# Patient Record
Sex: Female | Born: 1950 | Race: White | Hispanic: No | Marital: Married | Smoking: Never smoker
Health system: Southern US, Community
[De-identification: ages and names within clinical notes are randomized; demographics above are authoritative.]

## PROBLEM LIST (undated history)

## (undated) DIAGNOSIS — E079 Disorder of thyroid, unspecified: Secondary | ICD-10-CM

## (undated) DIAGNOSIS — I2510 Atherosclerotic heart disease of native coronary artery without angina pectoris: Secondary | ICD-10-CM

## (undated) DIAGNOSIS — I6529 Occlusion and stenosis of unspecified carotid artery: Secondary | ICD-10-CM

## (undated) DIAGNOSIS — Z955 Presence of coronary angioplasty implant and graft: Secondary | ICD-10-CM

## (undated) DIAGNOSIS — M419 Scoliosis, unspecified: Secondary | ICD-10-CM

## (undated) HISTORY — DX: Atherosclerotic heart disease of native coronary artery without angina pectoris: I25.10

## (undated) HISTORY — DX: Scoliosis, unspecified: M41.9

## (undated) HISTORY — DX: Occlusion and stenosis of unspecified carotid artery: I65.29

## (undated) HISTORY — PX: CORONARY ARTERY BYPASS GRAFT: SHX141

## (undated) HISTORY — DX: Presence of coronary angioplasty implant and graft: Z95.5

## (undated) HISTORY — PX: CORONARY ANGIOPLASTY: SHX604

## (undated) HISTORY — PX: TONSILLECTOMY: SUR1361

## (undated) HISTORY — DX: Disorder of thyroid, unspecified: E07.9

---

## 1997-05-28 HISTORY — PX: BREAST CYST EXCISION: SHX579

## 2002-07-27 HISTORY — PX: CARDIAC SURGERY: SHX584

## 2003-04-08 ENCOUNTER — Ambulatory Visit
Admit: 2003-04-08 | Disposition: A | Discharge status: Home / Self Care | Payer: Self-pay | Source: Ambulatory Visit | Admitting: Surgery

## 2003-05-17 ENCOUNTER — Ambulatory Visit: Admission: RE | Admit: 2003-05-17 | Payer: Self-pay | Source: Ambulatory Visit | Admitting: Surgery

## 2003-07-21 ENCOUNTER — Inpatient Hospital Stay
Admission: RE | Admit: 2003-07-21 | Disposition: A | Discharge status: Home / Self Care | Payer: Self-pay | Source: Ambulatory Visit

## 2003-07-29 ENCOUNTER — Ambulatory Visit: Admit: 2003-07-29 | Disposition: A | Discharge status: Home / Self Care | Payer: Self-pay | Source: Ambulatory Visit

## 2003-08-06 ENCOUNTER — Ambulatory Visit: Admit: 2003-08-06 | Disposition: A | Discharge status: Home / Self Care | Payer: Self-pay | Source: Ambulatory Visit

## 2003-08-08 ENCOUNTER — Inpatient Hospital Stay
Admission: EM | Admit: 2003-08-08 | Disposition: A | Discharge status: Home / Self Care | Payer: Self-pay | Source: Emergency Department | Admitting: Specialist

## 2003-08-23 ENCOUNTER — Ambulatory Visit: Admit: 2003-08-23 | Disposition: A | Discharge status: Home / Self Care | Payer: Self-pay | Source: Ambulatory Visit

## 2003-08-27 ENCOUNTER — Ambulatory Visit: Admit: 2003-08-27 | Disposition: A | Discharge status: Home / Self Care | Payer: Self-pay | Source: Ambulatory Visit

## 2003-09-03 ENCOUNTER — Ambulatory Visit: Admit: 2003-09-03 | Disposition: A | Discharge status: Home / Self Care | Payer: Self-pay | Source: Ambulatory Visit

## 2004-03-02 ENCOUNTER — Ambulatory Visit: Admit: 2004-03-02 | Disposition: A | Discharge status: Home / Self Care | Payer: Self-pay | Source: Ambulatory Visit

## 2004-03-31 ENCOUNTER — Ambulatory Visit: Admit: 2004-03-31 | Disposition: A | Discharge status: Home / Self Care | Payer: Self-pay | Source: Ambulatory Visit

## 2004-10-27 ENCOUNTER — Ambulatory Visit
Admission: RE | Admit: 2004-10-27 | Disposition: A | Discharge status: Home / Self Care | Payer: Self-pay | Source: Ambulatory Visit | Admitting: Cardiovascular Disease

## 2012-05-20 NOTE — Op Note (Unsigned)
DATE OF BIRTH:                        12-01-1950      ADMISSION DATE:                     05/17/2003            PATIENT LOCATION:                     [id number]            DATE OF PROCEDURE:                   05/17/2003      SURGEON:                            Latanya Presser, MD      ASSISTANT(S):                  PREOPERATIVE DIAGNOSIS:  LEFT BREAST MASSES TIMES THREE.            POSTOPERATIVE DIAGNOSIS:  LEFT BREAST MASSES TIMES THREE.            PROCEDURE:  EXCISION, LEFT BREAST MASSES TIMES THREE.            DESCRIPTION OF PROCEDURE:  The patient was taken to the operating suite and      each area was marked preoperatively with the patient's guidance.  The      breast was prepped and draped.  Xylocaine and Marcaine were infiltrated      into each of the areas of the incisions.  The first incision was made in      approximately the 12 o'clock position.  This incision was made and carried      down through the skin and subcutaneous tissue.  A large thick-walled      cystic-appearing mass was identified.  It was removed and marked as "12      o'clock mass."  This wound was then closed in layers with Vicryl and      Monocryl.            In the area of the 1 o'clock infiltrated area a circumareolar incision was      made, again carrying it down through the skin and subcutaneous tissue,      identifying a second similar-appearing thick-walled cystic mass which was      excised and sent to pathology for examination.  The cyst fluid contents      were sent for analysis.  Hemostasis was achieved.  The wound was closed      with Vicryl and Monocryl.            The 2 o'clock position mass area was similarly infiltrated.  A      circumareolar incision was made and the lesion was completely removed.  It      was similar in appearance to the first two lesions and marked as "2      o'clock."  The incision was closed in layers with Vicryl and Monocryl.  All      the wounds were dressed.  She was transferred to the recovery  room in      satisfactory condition.  ___________________________________          Date Signed: __________      Latanya Presser, MD  ([id number])            D: 05/19/2003 by Latanya Presser, MD      T: 05/19/2003 by [id number] ([id number]) ([id number])      cc:  Latanya Presser, MD

## 2012-05-29 NOTE — Procedures (Signed)
PATIENT NAME:  Teresa Briggs, Teresa Briggs       MED REC NO:      [id number]      ORDERING MD:   Barney Drain, MD              EXAM DATE:       08/09/2003      DICTATING MD:  Fortino Sic, MD            ACCOUNT NO:     [id number]      PATIENT DOB:     03-May-1951                  PATIENT LOC:     [id number]                  TAPE: 136            EXAMINATION: M-MODE, 2-D, DOPPLER AND COLOR FLOW DOPPLER ECHOCARDIOGRAM.            CLINICAL INDICATION: A 62 year old with pericardial effusion, status post      CABG.            INTERPRETATION: The following M-mode measurements are noted: RV 2 cm.      Aortic root 3 cm. Aortic cusp separation 1.8 cm. LA 4 cm. LV diastolic 4.2      cm. LV systolic 3.2 cm. Posterior wall and septal thickness 1.2 cm. EF 45%      which is mildly reduced.            2-D imaging demonstrates mild reduction in overall LV systolic function      with paradoxical septal motion. The RV is normal. The left atrium is      enlarged. Overall the LV size is, however, not enlarged. There is      concentric left ventricular hypertrophy present. There is a small anterior      and posterior pericardial effusion of equal size 1-1.3 cm. No pericardial      compression is seen. The aortic valve is trileaflet. The mitral valve,      tricuspid valve and pulmonic valve are normal.            Doppler imaging demonstrates mild mitral and mild tricuspid regurgitation      with RV systolic pressure of 30 mmHg and trace pulmonary insufficiency. No      diastolic dysfunction is present.            FINAL IMPRESSION/ASSESSMENT      1.   Mild reduction in overall LV systolic function.      2.   Paradoxical septal motion.      3.   Left atrial enlargement.      4.   Normal LV size.      5.   Left ventricular hypertrophy.      6.   Small posterior and anterior pericardial effusion noted with mild      mitral and tricuspid regurgitation present. No other significant      abnormalities are present.                         __________________________________      Fortino Sic, MD            [id number]      D: 08/09/2003  7:41 P      T: 08/10/2003  1:16 P      J: [id number]  N: [id number]      CC:   Fortino Sic, MD

## 2012-05-29 NOTE — Discharge Summary (Unsigned)
DATE OF BIRTH:                        [birth year]            ADMISSION DATE:                     07/21/2003      DISCHARGE DATE:                     07/26/2003            ATTENDING PHYSICIAN:                  Alvie Heidelberg, MD            DISCHARGE DIAGNOSES      1.   Arteriosclerotic coronary artery occlusive disease.      2.   History of hyperlipidemia.      3.   History of anemia.            HISTORY OF PRESENT ILLNESS:   The patient is a 62 year old woman with a      history of progressive chest tightness since last fall.  A thallium stress      test was abnormal.  She underwent cardiac catheterization on 08/18/2003 by      Dr. Garlan Fair revealing a 60-70% osteal left vein coronary stenosis and a 60%      osteal right coronary artery stenosis. The left ventricle contracted well.      Plans were made to proceed with coronary artery bypass grafting surgery.            HOSPITAL COURSE:  On 07/22/2003, the patient was taken to the operative      suite where Dr. Jerilee Hoh performed a coronary artery bypass grafting x      2.  The patient tolerated the procedure without any complications and was      transferred to the cardiovascular intensive care unit in stable condition.      The patient was extubated on 07/23/2003 and continued to improve.  The      patient was transferred to the cardiovascular step-down unit in stable      condition on 07/23/2003.  The patient continued to progress very well in      the cardiovascular step-down unit and the pacing wires were removed without      any further complication.  The patient continued to ambulate and gain her      strength.  The patient did report having a history of chronic anemia,      however on 07/26/2003, the patient's hemoglobin and hematocrit remained      stable and improved at 8.4 and 25.7.  It was discussed with the cardiac      surgeon on call and it was felt that the patient could be stable for      discharge home with followup in approximately two weeks.             DISCHARGE MEDICATIONS      1.   Amiodarone.      2.   Iron.      3.   Synthroid.      4.   Lipitor.      5.   Aspirin.      6.   Lopressor.      7.   Percocet and ibuprofen.  DISCHARGE INSTRUCTIONS:  The patient is Teresa Briggs to engage in any heavy lifting      or strenuous activity, nor engage in any driving for 4-6 weeks.  The      patient is to follow up in the cardiac surgery clinic in approximately two      weeks or sooner as needed.  The patient was given instructions on how to      take her medications and the patient and family demonstrated knowledge and      understanding of discharge instructions and treatment regimen.                                    ___________________________________          Date Signed: __________      Alvie Heidelberg, MD  ([id number])            D: 08/10/2003 by Genevive Bi, PA      T: 08/11/2003 by [id number] ([id number]) (Dorris Carnes: [id number])      cc:  Massie Maroon, MD          Alvie Heidelberg, MD

## 2012-05-29 NOTE — Discharge Summary (Unsigned)
DATE OF BIRTH:                        11-15-1950            ADMISSION DATE:                     08/08/2003      DISCHARGE DATE:                     08/11/2003            ATTENDING PHYSICIAN:                  Alvie Heidelberg, MD            HISTORY OF PRESENT ILLNESS:  [name] is a 62 year old female who      underwent coronary revascularization on 07/22/2003 by Dr. Jerilee Hoh      with a coronary artery bypass x2 with bilateral internal mammary arteries      for revascularization.  She has a preoperative history of hypothyroidism      and hyperlipidemia, and had been discharged home.  While at home she had a      progressive cough which seemed to worsen and on 08/06/2003 she was seen in      the cardiac surgery clinic.  She was found to have a moderate sized left      pleural effusion and she underwent a thoracentesis in the cardiac surgery      clinic for a liter of fluid.  She continued to have persistent nagging dry      cough as well as increasing shortness of breath and presented to the      emergency room on 08/08/2003 with similar symptoms.  The decubitus films in      the emergency room were Teresa Briggs impressive, and because of her significant      cough, it was felt that she should be admitted to the hospital for further      evaluation.            PHYSICAL EXAMINATION:  Physical exam on admission revealed a pleasant      cooperative 62 year old white female who appeared younger than stated age,      in no acute distress.  Heart regular rate and rhythm, no murmurs, rubs or      gallops.  Lungs showed decreased breath sounds in the left base.  Incisions      were clean, dry and intact.  Sternum was intact.  Abdomen was benign.      Neurologic was intact.            LABORATORY DATA:  Admitting laboratory values reveal a white blood count of      7300 with hematocrit of 25.5, platelet count of 430,000, sodium 141,      potassium 4.2, chloride 107, CO2 27, BUN 12, and a creatinine of 0.5.  BNP      on  admission was 195 and CPK and troponin were negative.            HOSPITAL COURSE:  [name] was admitted from the emergency room to      evaluate shortness of breath and chronic cough.  Chest CT was performed      which showed a moderate left pleural effusion with atelectasis and no      pulmonary embolus.  She also underwent an echocardiogram  which showed a      small insignificant pericardial effusion with otherwise normal      echocardiogram.  She underwent a repeat x-ray on 08/10/2003 and at that      time there was a question of some layering of the left effusion and she      underwent a second left thoracentesis for 550 mL of fluid.  She was also      seen in consultation by Dr. Hanley Seamen and she was started on [facility] as well as Pulmicort inhaler and Humibid for her cough.  Repeat      chest x-ray was performed on 08/11/2003 which showed a tiny residual left      effusion without atelectasis with a normal heart shadow.  It was felt that      per the pulmonologist and the cardiac surgery team, the patient could be      discharged home and be followed as an outpatient.            DISPOSITION:  [name] is being discharged home to the care of      heart rate family.  Her discharge medications are as follows:  1 enteric      coated aspirin per day, Lipitor 20 mg daily, iron sulfate 325 mg 3 times a      day, Levothroid 0.15 mg daily, Toprol XL 25 mg daily, Darvocet-N 100 1-2      q.4-6 h. as needed for pain, Humibid DM 600 mg twice a day, Tessalon Perles      200 mg 3 times a day and Pulmicort inhaler 2 puffs twice a day.  She will      follow-up in the cardiac surgery clinic in 1 week with a chest x-ray and      she will follow-up with Dr. Janan Ridge in 1 week with a chest x-ray.            FINAL DIAGNOSIS:  Recurrent left pleural effusion.            ASSOCIATED DIAGNOSES:  History of hypothyroidism, history of coronary      artery bypass on 07/22/2003, history of anemia, history of  left effusion      requiring left thoracentesis on 08/06/2003.                                    ___________________________________          Date Signed: __________      Alvie Heidelberg, MD  ([id number])            D: 08/11/2003 by Damita Lack, PA      T: 08/12/2003 by [id number] ([id number]) (Dorris Carnes: [id number])      cc:  Carmelia Roller, MD          Syliva Overman, MD          Alvie Heidelberg, MD

## 2012-05-29 NOTE — Op Note (Unsigned)
DATE OF BIRTH:                        05-05-1951      ADMISSION DATE:                     07/21/2003            PATIENT LOCATION:                     [id number]            DATE OF CATHETERIZATION:              07/21/2003      CARDIOLOGIST:                       Massie Maroon, MD            CATHETERIZATION NUMBER:            REFERRING PHYSICIAN:  Juliene Pina, M.D.      Alto Denver, M.D.            PRECATHETERIZATION DIAGNOSIS:  ANGINA PECTORIS WITH ABNORMAL NONINVASIVE      IMAGING.            POSTCATHETERIZATION DIAGNOSIS:  BI-OSTIAL CORONARY STENOSIS.            TITLE OF PROCEDURE      1.   LEFT HEART CATHETERIZATION.      2.   CORONARY ANGIOGRAPHY.      3.   LEFT VENTRICULOGRAPHY.      4.   AORTIC ROOT ANGIOGRAPHY.            DESCRIPTION OF PROCEDURE:  Informed consent was obtained.  The patient was      brought to the Cardiac Catheterization Laboratory and prepped and draped in      a sterile manner.  Xylocaine 1% was used to locally infiltrate the right      groin.  The right femoral artery pulsation was palpated and the artery was      cannulated using the modified Seldinger technique and a 4-French sheath was      inserted into the vessel.  Subsequently, a 4-French Judkins left 4 cm      diagnostic catheter was passed over a wire to the aortic root.  This      catheter was used to engage the left coronary artery and angiography was      performed in multiple projections.  In a similar fashion a 3-DRC right      coronary catheter was used to perform angiography of the right coronary      artery.  We then performed left ventriculography in the RAO projection      using 36 mL of contrast.  A pullback was then performed after which aortic      root angiography was performed using 40 mL of contrast.  The patient      tolerated the procedure well and there were no immediate complications.            FINDINGS            1.   HEMODYNAMIC DATA:  The left ventricular systolic pressure is 130.  The      left  ventricular end-diastolic pressure is 10 mmHg.  There was no      significant gradient noted across the aortic valve on pullback of the  pigtail catheter.            2.   CORONARY ANGIOGRAPHY           a.   Left Main:  The left coronary artery arose from the left sinus of      Valsalva in the usual manner and gave rise to the left main which exhibited      an ostial 60% to possibly 70% stenosis.  The left main bifurcated into the      left anterior descending artery and left circumflex artery.           b.   Left Anterior Descending:  The left anterior descending artery      arose from the left main.  It coursed in the anterior interventricular      groove giving rise to septal perforator and diagonal branches along the      way.  The left anterior descending artery and diagonal branches exhibited      minimal diffuse luminal irregularities.  The vessel did wrap around the      apex.           c.   Left Circumflex:  The left circumflex artery arose from the left      main and it gave rise to a very high obtuse marginal branch.  Beyond this,      the left circumflex gave rise to a rather large obtuse marginal which was      without significant disease.  The A-V groove portion of the left circumflex      continued and ended as two rather small obtuse marginals.           d.   Right Coronary Artery:  The right coronary artery arose from the      right sinus of Valsalva in the usual manner and this exhibited an      approximately 60-70% ostial stenosis.  The remainder of the vessel      exhibited mild diffuse disease.  This vessel was dominant, giving rise to      the posterior descending artery and posterolateral branches, both of which      exhibited no significant disease.            3.   LEFT VENTRICULOGRAPHY:  Left ventriculography in the RAO projection      revealed an overall ejection fraction in the range of about 50% with mild      global hypokinesia.  No significant mitral regurgitation was noted.             4.   AORTIC ROOT ANGIOGRAPHY:  Aortic root angiography revealed no evidence      of aortic regurgitation.  The aortic valve appeared to be trileaflet.      There was mild dilatation of the ascending aorta.            IMPRESSION      1.   Bi-ostial coronary stenosis.      2.   Mild global hypokinesia with an ejection fraction of 50%.      3.   Mildly dilated ascending aorta.            PLAN:  Coronary artery bypass graft surgery evaluation.                        ___________________________________          Date Signed: __________      Marcello Fennel  Garlan Fair, MD  ([id number])            D: 07/21/2003 by Massie Maroon, MD      T: 07/22/2003 by [id number] ([id number]) Dorris Carnes: [id number])      cc:  Massie Maroon, MD            Dr. Dorena Cookey M.D., [address], [address], [place], [place] [address]

## 2012-05-29 NOTE — Op Note (Unsigned)
DATE OF BIRTH:                        March 31, 1951      ADMISSION DATE:                     07/21/2003            PATIENT LOCATION:                    [id number]            DATE OF PROCEDURE:                   07/22/2003      SURGEON:                            Alvie Heidelberg, MD      ASSISTANT(S):                  PREOPERATIVE DIAGNOSIS:             ARTERIOSCLEROTIC CORONARY ARTERY                                          OCCLUSIVE DISEASE.            POSTOPERATIVE DIAGNOSIS:            ARTERIOSCLEROTIC CORONARY ARTERY                                          OCCLUSIVE DISEASE.            TITLE OF PROCEDURE:                 BYPASS TO THE LEFT ANTERIOR DESCENDING                                          CORONARY ARTERY USING A LEFT INTERNAL                                          MAMMARY ARTERY GRAFT AND BYPASS TO THE                                          RIGHT CORONARY ARTERY USING A RIGHT                                          INTERNAL MAMMARY ARTERY GRAFT (TWO                                          SEPARATE BYPASS GRAFTS).            ANESTHESIA:  GENERAL ENDOTRACHEAL.                  INDICATIONS:                         The patient is a 61 year old lady with      a history of progressive chest tightness since last fall. A thallium stress      test was abnormal. She underwent cardiac catheterization yesterday by Dr.      Garlan Fair revealing a 60 to 70% ostial left main coronary stenosis and a 60%      ostial right coronary artery stenosis. The left ventricle contracted well.      Plans were made to proceed with coronary artery bypass surgery.            FINDINGS:                            The distal coronary arteries appeared      normal. Both mammary arteries were excellent vessels to be used as bypass      conduits.            PROCEDURE:                          Under general endotracheal anesthesia,      the patient's chest, abdomen and legs were prepared and draped in a sterile       fashion. A median sternotomy incision was made and the pericardium incised.      The left internal mammary artery was dissected from the chest wall as a      pedicle graft with the surrounding veins. The right internal mammary artery      also was dissected from the chest wall to be used as a pedicle graft.      Heparin was injected intravenously. A single venous drainage cannula was      placed through a pursestring in the right atrium and the perfusion cannula      was placed in the aorta. Cardiopulmonary bypass was begun and the patient      cooled to 34 degrees centigrade. A coronary sinus catheter was placed      through a right atrial pursestring and secured in place. The aorta was      cross-clamped and the heart immersed in cold water. Cold blood cardioplegic      solution was injected antegrade through the aortic root and then retrograde      through the coronary sinus catheter. Intermittently during aortic      cross-clamping, cold blood cardioplegic solution was reinjected retrograde      through the coronary sinus catheter. The left internal mammary artery was      sutured to the LAD with continuous 8-0 Prolene. The right internal mammary      artery was sutured to the right coronary artery using continuous 8-0      Prolene. Warm blood cardioplegic solution enriched with glutamate and      Aspartate was injected retrograde through the coronary sinus catheter. The      aortic cross-clamp was removed and the heart defibrillated. Rewarming was      completed. Temporary atrial and ventricular pacing wires were secured in      place. Cardiopulmonary bypass was discontinued without difficulty. The      cannula was removed from the right  atrium and the pursestring tied down and      oversewn with 4-0 Prolene. The aortic perfusion cannula was removed and the      aortotomy oversewn with 4-0 Prolene. The coronary sinus catheter was      removed and the right atrial pursestring secured and oversewn with 4-0       Prolene. A #19 Blake drain was placed in the anterior mediastinum and a #19      Blake drain was placed in each pleural cavity. The sternum was approximated      with #5 wire. The linea alba and the tissue overlying the sternum was      closed in layers using 0 Vicryl and the skin edges were approximated with      an intercuticular suture of 4-0 Vicryl. Sterile dressings were applied and      the patient taken to the cardiovascular intensive care unit in satisfactory      condition.                        ___________________________________          Date Signed: __________      Alvie Heidelberg, MD  ([id number])            D: 07/22/2003 by Alvie Heidelberg, MD      T: 07/23/2003 by Vilma Prader ([id number]) ([id number])      cc:  Massie Maroon, MD          Alvie Heidelberg, MD

## 2012-05-29 NOTE — Procedures (Signed)
PATIENT NAME:  Teresa Briggs, Teresa Briggs       MED REC NO:      [id number]      ORDERING MD:   Carmelia Roller, MD           EXAM DATE:       08/27/2003      DICTATING MD:  Deliah Goody, MD          ACCOUNT NO:     [id number]      PATIENT DOB:     [birth year]                  PATIENT LOC:     RAD                  TAPE: 179            EXAMINATION: M-MODE, 2-D, COLOR FLOW, PULSED WAVE AND CONTINUOUS WAVE      DOPPLER ECHOCARDIOGRAM.            CLINICAL INDICATION: Follow up pericardial effusion, history of bypass      surgery.            INTERPRETATION: Measurements: RV 2.2 cm. AO 3 cm. ACS 2 cm. LA 3.8 cm.      LVIDD 4.4 cm. LVIDS 3 cm. IVS 1.1 cm. LVPW 1.1 cm. Shortening fraction 32%.      Ejection fraction 60%.            The left ventricle is normal in size. There is paradoxical septal motion      but overall left ventricular contractility is preserved with an ejection      fraction estimated at 60%. Paradoxical septal motion is often seen post      bypass surgery. Other cardiac chambers are of normal size. No intracardiac      thrombus. Trace or physiologic pericardial effusion is present. No cardiac      tamponade noted.            Valve structures are normal.            Doppler interrogation reveals mild mitral regurgitation by color flow      Doppler and mild tricuspid regurgitation by color flow Doppler. The      calculated right ventricular systolic pressure is 25 mmHg which is within      the normal range.            FINAL IMPRESSION/ASSESSMENT      1.   Trace pericardial effusion, improved from prior study.      2.   Normal left ventricular systolic function.      3.   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      4.   Mild tricuspid regurgitation without pulmonary hypertension.      5.   Mild mitral regurgitation.                        __________________________________      Deliah Goody, MD            [id number]      D: 08/27/2003  4:20 P      T: 08/30/2003 12:25 P      J: [id number]      N:  [id number]      CC:   Deliah Goody, MD         Barney Drain, MD

## 2012-05-30 NOTE — Op Note (Signed)
DATE OF BIRTH:                        12-09-1950      ADMISSION DATE:                     10/27/2004            PATIENT LOCATION:                     [facility]            DATE OF CATHETERIZATION:      CARDIOLOGIST:                       Massie Maroon, MD            CATHETERIZATION NUMBER:            PRECATHETERIZATION DIAGNOSIS:  ANGINA PECTORIS.            POSTCATHETERIZATION DIAGNOSES      1.   WIDELY PATENT LEFT INTERNAL MAMMARY ARTERY TO LEFT ANTERIOR DESCENDING      GRAFT.      2.   ATRETIC RIGHT INFRAMAMMARY ARTERY TO RIGHT CORONARY GRAFT DUE TO      COMPETITIVE FLOW.      3.   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      4.   NATIVE OSTIAL/PROXIMAL RIGHT CORONARY ARTERY STENOSIS OF ABOUT 60%.      5.   NORMAL LEFT VENTRICULAR SYSTOLIC FUNCTION WITH AN ESTIMATED EJECTION      FRACTION OF 60%.            PROCEDURE      1.   LEFT HEART CATHETERIZATION WITH CORONARY ANGIOGRAPHY AND LEFT      VENTRICULOGRAPHY.      2.   LEFT INTERNAL MAMMARY ARTERY ANGIOGRAPHY.      3.   RIGHT INFRAMAMMARY ARTERY ANGIOGRAPHY (IN SITU RIGHT INFRAMAMMARY      ARTERY).            DESCRIPTION OF PROCEDURE:  Informed consent was obtained.  The patient was      brought to cardiac catheterization laboratory and prepped and draped in a      sterile manner.  Then 1% Xylocaine was used to locally infiltrate the left      groin (patient request due to previous hematoma on right).  The left      femoral artery pulsation was palpated and the artery cannulated using the      modified Seldinger technique, and a 4-French sheath was inserted into the      vessel.  Subsequently, a 4-French JL-4 right coronary catheter was passed      over a J-wire to the _____ to engage the native right coronary artery, LIMA      graft, and RIMA graft.  We then used a 4-French JL-4 catheter to perform      angiography of the left coronary artery and a 4-French pigtail to perform      left ventriculography in the RAO projection.  Manual hemostasis was  applied      for closure.  The patient tolerated the procedure without complications.            FINDINGS:  Hemodynamic _____ left ventricular systolic pressure 120-130,      left ventricular end-diastolic pressure 10 mmHg.  There was no significant  gradient noted across the aortic valve on pullback of the pigtail      catheter.            CORONARY ANGIOGRAPHY:  The left coronary artery arose from the left sinus      of Valsalva in the usual manner and gave rise to the left main, which      bifurcated to the left anterior descending artery and left circumflex      artery.  The left main exhibited a severe ostial stenosis of 80%.  The left      main bifurcated into the left anterior descending artery and left      circumflex artery.  The left main ostial stenosis of 80% appears to have      progressed from the angiogram done prior to her bypass on 07/21/2003.            The left anterior descending artery demonstrated a long area of moderate      disease with diffuse narrowing concentrically along the course of the      vessel until the level of the first major septal perforator.  There appears      to be a concentric 40% to 50% stenosis.  The major diagonal branch rises      before the first major septal perforator and exhibits no significant      disease.  The LAD beyond this demonstrates competitive flow and minimal      disease.  There is reflux of contrast into the LIMA graft.            The left circumflex artery arises from the left main and this vessel      exhibits only minimal disease.  It consists of a very large first major      obtuse marginal and then a smaller terminal obtuse marginal, all of which      exhibit no significant disease.            The right coronary artery arises from the right sinus of Valsalva in the      usual manner and this vessel is dominant.  This vessel exhibits a severe      ostial stenosis which extends proximally about 5 mm and is up to 60%.      Beyond this the vessel  exhibits only minimal luminal irregularities and it      is a large dominant right coronary artery.  The posterior descending artery      and posterolateral branches are without significant disease.  It appears      that there has been some mild regression of the ostial right coronary      artery stenosis from the previous cardiac catheterization.            Left ventriculography in the RAO projection revealed an overall normal left      ventricular systolic function, with an estimated ejection fraction of 60%      with normal wall motion in all segments.  Mild mitral regurgitation maybe      present.            GRAFT ANGIOGRAPHY:  The LIMA to LAD graft is widely patent.  The graft is      well seen from the ostium to the anastomosis with all sites being widely      patent.  Anastomosis is also well seen and widely patent.  There is brisk      antegrade flow into  an LAD which exhibits mild disease and does wrap around      the apex.  There is retrograde flow with a reflux of contrast into the left      circumflex vessel.            The RIMA graft to the right coronary artery (in situ graft) is diffusely      atretic.  Several angiograms were performed; however, we were not able to      visualize the anastomosis due to the brisk competitive flow, but it appears      that the anastomosis is patent.  There is no focal stenosis within the      graft itself.  It is simply diffusely atretic.            IMPRESSION      1.   Bi-ostial native coronary disease.      2.   Widely patent left internal mammary artery to left anterior descending      graft.      3.   Diffusely atretic right inframammary artery to right coronary artery      graft.      4.   Normal left ventricular systolic function.            DISCUSSION:  The patient's right inframammary artery graft is diffusely      atretic and this is likely due to competitive flow in the native right      coronary artery; it appears that there has been some plaque regression  at      the ostium/proximal portion of the right coronary artery which allows for      flow through this vessel, and therefore leading to significant competitive      flow beyond the anastomosis, and therefore leading to atresia of the right      inframammary artery graft.  At this time, I do not feel that the patient      has significant myocardium at risk.  However, an exercise nuclear imaging      study can be performed, and if this shows inferior ischemia, then I would      recommend an intervention to the ostium of the right coronary artery which      at this time does not appear to be severely stenosed.  Any other area of      ischemia, should not warrant a cardiac catheterization as the LIMA to LAD      graft is widely patent.                                    Electronic Signing MD: Massie Maroon, MD  ([id number])            D: 10/27/2004 by Massie Maroon, MD      T: 10/27/2004 by [id number] ([id number]) Dorris Carnes: [id number])      cc:  Massie Maroon, MD            Dr. Juliene Pina, [facility], [place], [place]

## 2013-06-15 ENCOUNTER — Encounter
Admission: RE | Disposition: A | Discharge status: Home / Self Care | Payer: Self-pay | Source: Ambulatory Visit | Attending: Cardiovascular Disease

## 2013-06-15 ENCOUNTER — Ambulatory Visit: Payer: No Typology Code available for payment source | Admitting: Cardiovascular Disease

## 2013-06-15 ENCOUNTER — Ambulatory Visit
Admission: RE | Admit: 2013-06-15 | Discharge: 2013-06-16 | Disposition: A | Discharge status: Home / Self Care | Payer: No Typology Code available for payment source | Source: Ambulatory Visit | Attending: Cardiovascular Disease | Admitting: Cardiovascular Disease

## 2013-06-15 DIAGNOSIS — I959 Hypotension, unspecified: Secondary | ICD-10-CM | POA: Insufficient documentation

## 2013-06-15 DIAGNOSIS — I6529 Occlusion and stenosis of unspecified carotid artery: Secondary | ICD-10-CM | POA: Insufficient documentation

## 2013-06-15 DIAGNOSIS — I10 Essential (primary) hypertension: Secondary | ICD-10-CM | POA: Insufficient documentation

## 2013-06-15 DIAGNOSIS — I2510 Atherosclerotic heart disease of native coronary artery without angina pectoris: Secondary | ICD-10-CM | POA: Insufficient documentation

## 2013-06-15 DIAGNOSIS — Z6829 Body mass index (BMI) 29.0-29.9, adult: Secondary | ICD-10-CM | POA: Insufficient documentation

## 2013-06-15 DIAGNOSIS — E039 Hypothyroidism, unspecified: Secondary | ICD-10-CM | POA: Insufficient documentation

## 2013-06-15 DIAGNOSIS — E669 Obesity, unspecified: Secondary | ICD-10-CM | POA: Insufficient documentation

## 2013-06-15 DIAGNOSIS — R12 Heartburn: Secondary | ICD-10-CM | POA: Insufficient documentation

## 2013-06-15 DIAGNOSIS — I658 Occlusion and stenosis of other precerebral arteries: Secondary | ICD-10-CM | POA: Insufficient documentation

## 2013-06-15 DIAGNOSIS — Z951 Presence of aortocoronary bypass graft: Secondary | ICD-10-CM | POA: Insufficient documentation

## 2013-06-15 SURGERY — STENT CAROTID W/ PROTECTION
Anesthesia: Conscious Sedation

## 2013-06-15 MED ORDER — ONDANSETRON HCL 4 MG/2ML IJ SOLN
4.0000 mg | Freq: Every day | INTRAMUSCULAR | Status: DC | PRN
Start: 2013-06-15 — End: 2013-06-16

## 2013-06-15 MED ORDER — HEPARIN WASH BOWL 5 UNITS/ML SOLN (CATH LAB)
Status: AC
Start: 2013-06-15 — End: 2013-06-15
  Filled 2013-06-15: qty 2000

## 2013-06-15 MED ORDER — CLOPIDOGREL BISULFATE 75 MG PO TABS
75.0000 mg | ORAL_TABLET | Freq: Every day | ORAL | Status: DC
Start: 2013-06-15 — End: 2013-06-16
  Filled 2013-06-15: qty 1

## 2013-06-15 MED ORDER — SODIUM CHLORIDE 0.9 % IV SOLN
INTRAVENOUS | Status: DC
Start: 2013-06-15 — End: 2013-06-16

## 2013-06-15 MED ORDER — FENTANYL CITRATE 0.05 MG/ML IJ SOLN
INTRAMUSCULAR | Status: AC
Start: 2013-06-15 — End: 2013-06-15
  Administered 2013-06-15: 75 ug via INTRAVENOUS
  Filled 2013-06-15: qty 2

## 2013-06-15 MED ORDER — MIDAZOLAM HCL 2 MG/2ML IJ SOLN
INTRAMUSCULAR | Status: AC
Start: 2013-06-15 — End: 2013-06-15
  Administered 2013-06-15: 2 mg via INTRAVENOUS
  Filled 2013-06-15: qty 2

## 2013-06-15 MED ORDER — LIDOCAINE HCL (PF) 1 % IJ SOLN
INTRAMUSCULAR | Status: AC
Start: 2013-06-15 — End: 2013-06-15
  Administered 2013-06-15: 10 mL via SUBCUTANEOUS
  Filled 2013-06-15: qty 30

## 2013-06-15 MED ORDER — IODIXANOL 320 MG/ML IV SOLN
100.0000 mL | Freq: Once | INTRAVENOUS | Status: DC | PRN
Start: 2013-06-15 — End: 2013-06-15

## 2013-06-15 MED ORDER — MIDAZOLAM HCL 2 MG/2ML IJ SOLN
INTRAMUSCULAR | Status: AC
Start: 2013-06-15 — End: 2013-06-15
  Administered 2013-06-15: 1 mg via INTRAVENOUS
  Filled 2013-06-15: qty 2

## 2013-06-15 MED ORDER — DOPAMINE-DEXTROSE 1.6-5 MG/ML-% IV SOLN
3.0000 ug/kg/min | INTRAVENOUS | Status: DC
Start: 2013-06-15 — End: 2013-06-16
  Administered 2013-06-15: 3 ug/kg/min via INTRAVENOUS
  Administered 2013-06-15: 2 ug/kg/min via INTRAVENOUS

## 2013-06-15 MED ORDER — ATROPINE SULFATE 0.1 MG/ML IJ SOLN
INTRAMUSCULAR | Status: AC
Start: 2013-06-15 — End: 2013-06-15
  Administered 2013-06-15: 0.5 mg via INTRAVENOUS
  Filled 2013-06-15: qty 10

## 2013-06-15 MED ORDER — ZOLPIDEM TARTRATE 5 MG PO TABS
10.0000 mg | ORAL_TABLET | Freq: Every evening | ORAL | Status: DC | PRN
Start: 2013-06-15 — End: 2013-06-16

## 2013-06-15 MED ORDER — ATORVASTATIN CALCIUM 10 MG PO TABS
20.0000 mg | ORAL_TABLET | Freq: Every day | ORAL | Status: DC
Start: 2013-06-15 — End: 2013-06-16
  Administered 2013-06-15 – 2013-06-16 (×2): 20 mg via ORAL
  Filled 2013-06-15 (×2): qty 2

## 2013-06-15 MED ORDER — FAMOTIDINE 20 MG PO TABS
20.0000 mg | ORAL_TABLET | Freq: Two times a day (BID) | ORAL | Status: DC
Start: 2013-06-15 — End: 2013-06-16
  Administered 2013-06-15: 20 mg via ORAL
  Filled 2013-06-15 (×2): qty 1

## 2013-06-15 MED ORDER — DOPAMINE-DEXTROSE 1.6-5 MG/ML-% IV SOLN
INTRAVENOUS | Status: AC
Start: 2013-06-15 — End: 2013-06-15
  Filled 2013-06-15: qty 250

## 2013-06-15 MED ORDER — LEVOTHYROXINE SODIUM 25 MCG PO TABS
137.0000 ug | ORAL_TABLET | Freq: Every day | ORAL | Status: DC
Start: 2013-06-15 — End: 2013-06-16
  Administered 2013-06-16: 137 ug via ORAL
  Filled 2013-06-15 (×3): qty 1

## 2013-06-15 MED ORDER — IBUPROFEN 400 MG PO TABS
600.0000 mg | ORAL_TABLET | Freq: Three times a day (TID) | ORAL | Status: DC | PRN
Start: 2013-06-15 — End: 2013-06-16
  Administered 2013-06-15 – 2013-06-16 (×4): 600 mg via ORAL
  Filled 2013-06-15 (×4): qty 2

## 2013-06-15 MED ORDER — SODIUM CHLORIDE 0.9 % IV SOLN
INTRAVENOUS | Status: AC
Start: 2013-06-15 — End: 2013-06-15

## 2013-06-15 MED ORDER — HEPARIN SODIUM (PORCINE) 1000 UNIT/ML IJ SOLN
INTRAMUSCULAR | Status: AC
Start: 2013-06-15 — End: 2013-06-15
  Administered 2013-06-15: 3000 [IU] via INTRAVENOUS
  Filled 2013-06-15: qty 10

## 2013-06-15 MED ORDER — FENTANYL CITRATE 0.05 MG/ML IJ SOLN
INTRAMUSCULAR | Status: AC
Start: 2013-06-15 — End: 2013-06-15
  Administered 2013-06-15: 100 ug via INTRAVENOUS
  Filled 2013-06-15: qty 2

## 2013-06-15 NOTE — Progress Notes (Addendum)
Blood pressure 99/51. Pt resting. dopamin drip decrease by 1  at 2230. At 2300 blood pressure down to 90/51. dopamin drip back to 3 mcg/kg/min. Continue to monitor.

## 2013-06-15 NOTE — Progress Notes (Signed)
Pt assisted out of bed to bathroom. Pt tolerated very well. Vital signs stable. Sinus bradycardia 51's  on monitor. Pt denies lightheaded. dopamin drip and iv normal saline infusing. Continue to monitor.

## 2013-06-15 NOTE — Plan of Care (Signed)
Problem: Hemodynamic Status: Cardiac  Goal: Stable vital signs and fluid balance  Outcome: Progressing  Right groin site C/D/I.  No bleeding, no hematoma. + pulses.  Patient has Dopamine infusing at 3 mcg/kg/min to help increase and stabilize BP.  SB 50's on monitor.  Will continue to monitor patient.

## 2013-06-15 NOTE — Progress Notes (Signed)
Patient still complaining about jaw pain and head pressure to the right side.  Neuro checks continue to be normal without any deficits.  Blood pressures have been up and down with SBP in the 70's -low 100's.  Dopamine gtt still infusing at 3 mcg/kg/min.  Right groin site C/D/I.  No bleeding, no hematoma. + pulses.  Attempted to get patient OOB x2 with assistance and patient had a little vasovagal.  Dr. Richrd Sox made aware and came to the bedside.

## 2013-06-15 NOTE — Progress Notes (Signed)
Pt admitted to [facility] rm 2 alert and oriented X4, denied pain, no SOB, did not appear to be in any distress. Pt oriented to room, call light placed within reach and Pt taught how to use. Family by bedside.    Pre- Cath Teaching and Learning Objectives   Learner: Teresa Briggs,   Preference for learning: Verbal  Teaching Method: Verbal Instruction   Outcome of Learning: verbalized understanding    Described/Demonstrated the following:     + Responsibilities of patient's care  + Cardiac Cath/PTCA/Stent/Brachytherapy   + Purpose of procedure  + Need to be NPO pre-procedure  + Need for maintaining bedrest & straight leg post-procedure & sheath removal.  + Necessary fluid intake after procedure  + Symptoms of bleeding & states plan to notify nurse.

## 2013-06-15 NOTE — Progress Notes (Signed)
Received report and patient into ICAR 3i8 s/p carotid stent. Assumed patient's care. ID band verified, alert and oriented x 3, VSS, aldrete 10, pain 3 /10, rhythm SR 60. Call bell within reach.

## 2013-06-15 NOTE — Progress Notes (Addendum)
Patient arrived into room 39 with complaints to her jaw (right side) and also some head pressure on the right.  Neuro checks were done.  No deficits noted.  Blood pressure low.  Dopamine infusing at 3 mcg/kg/min.  Right groin site C/D/I.  No bleeding, no hematoma. + pulses.  Dr. Richrd Sox made aware and came to assess patient.

## 2013-06-15 NOTE — Progress Notes (Signed)
Dr. Richrd Sox called to inform patient's condition. MD updated that patient did fine after assisted out of bed to bathroom. Dr. Richrd Sox wants me to taper patient's dopamin drip to 0  by am. Continue to monitor.

## 2013-06-16 DIAGNOSIS — I6529 Occlusion and stenosis of unspecified carotid artery: Secondary | ICD-10-CM

## 2013-06-16 LAB — CBC
Hematocrit: 37.2 % (ref 37.0–47.0)
Hgb: 12.2 g/dL (ref 12.0–16.0)
MCH: 29.3 pg (ref 28.0–32.0)
MCHC: 32.8 g/dL (ref 32.0–36.0)
MCV: 89.2 fL (ref 80.0–100.0)
MPV: 10.8 fL (ref 9.4–12.3)
Nucleated RBC: 0 (ref 0–1)
Platelets: 190 10*3/uL (ref 140–400)
RBC: 4.17 10*6/uL — ABNORMAL LOW (ref 4.20–5.40)
RDW: 13 % (ref 12–15)
WBC: 5.52 10*3/uL (ref 3.50–10.80)

## 2013-06-16 LAB — BASIC METABOLIC PANEL
BUN: 13 mg/dL (ref 7.0–19.0)
CO2: 22 mEq/L (ref 22–29)
Calcium: 9.3 mg/dL (ref 8.5–10.5)
Chloride: 111 mEq/L — ABNORMAL HIGH (ref 98–107)
Creatinine: 0.7 mg/dL (ref 0.6–1.0)
Glucose: 93 mg/dL (ref 70–100)
Potassium: 4 mEq/L (ref 3.5–5.1)
Sodium: 141 mEq/L (ref 136–145)

## 2013-06-16 LAB — GFR: EGFR: >60

## 2013-06-16 MED ORDER — CLOPIDOGREL BISULFATE 75 MG PO TABS
300.0000 mg | ORAL_TABLET | Freq: Once | ORAL | Status: AC
Start: 2013-06-16 — End: 2013-06-16
  Administered 2013-06-16: 300 mg via ORAL
  Filled 2013-06-16: qty 4

## 2013-06-16 MED ORDER — CLOPIDOGREL BISULFATE 75 MG PO TABS
75.0000 mg | ORAL_TABLET | Freq: Every day | ORAL | Status: DC
Start: 2013-06-17 — End: 2013-06-16

## 2013-06-16 MED ORDER — PANTOPRAZOLE SODIUM 40 MG PO TBEC
40.0000 mg | DELAYED_RELEASE_TABLET | Freq: Every day | ORAL | Status: DC
Start: 2013-06-16 — End: 2013-06-16
  Administered 2013-06-16: 40 mg via ORAL
  Filled 2013-06-16: qty 1

## 2013-06-16 MED ORDER — PANTOPRAZOLE SODIUM 40 MG PO TBEC
40.0000 mg | DELAYED_RELEASE_TABLET | Freq: Every day | ORAL | Status: DC
Start: 2013-06-16 — End: 2015-06-02

## 2013-06-16 MED ORDER — ATORVASTATIN CALCIUM 20 MG PO TABS
20.0000 mg | ORAL_TABLET | Freq: Every day | ORAL | Status: DC
Start: 2013-06-16 — End: 2014-07-01

## 2013-06-16 NOTE — Discharge Instructions (Signed)
[facility]                                                                    [address]  [place], [place]                                                 Interventional Cardiovascular Admission and Recovery                                                                 Post Catheterization Discharge Instructions                                                                                   Groin Access    Closure Device: Perclose      Access Site: Right Femoral Artery    Activity:  1. No driving for 24 hours following the procedure due to medications you may have received.   2. Rest today and tomorrow, gradually increasing to your usual activities.  3. Limit stair usage for the next 24 hours. If you must use the stairs, take them one at a time, leading with your unaffected leg holding pressure on the bandaged site.  4. Do not lift anything over 10 pounds in weight for five (5) days. That includes pushing, pulling, dragging or moving anything.  5. No strenuous activity for five (5) days. Do not attempt anything that may cause fatigue, shortness of breath, perspiration or chest pain.   6. Support the bandaged site when coughing or sneezing. Do not strain when having a bowel movement.     Access Site Care:  1. No tub baths, hot tubs, pools or sitting in water for one week.  2. You may shower 24 hours after your procedure. Leave the bandage in place and just let the water passively flow over the site.  3. REMOVE the dressing 48 hours after your procedure, either before or during your shower. Again, let the water passively flow over the site, wash gently with your hand, then pat the area dry.   4. Do not rub, pick or scratch the area.   5. Do not apply creams, powders, lotions, or ointments to the site.   6. Apply a regular sized Band-Aid to the puncture site and change it daily for five (5) days. You may shower  daily.  7. If the puncture site looks like it is becoming infected or not healing properly, (red, hot to touch, drainage, and/or fever over 101 degrees F), call the doctor who performed the procedure.  Normal Observation:  1. You may feel tenderness.   2. You may experience some mild bruising.   3. You may feel a small lump, about the size of an olive pit, which should disappear within 90 days if a closure device is used.   Call 911 if:  1. You are experiencing unrelieved chest pain.  2. You notice bleeding either through the dressing or underneath the skin. If the blood is trapped under the skin, the area will hurt, become swollen and hard. If either happens, lay down flat and hold pressure on the site. This is an arterial bleed, and may become an emergency if unattended.   3. Your leg becomes cold, numb, painful, grayish in color, or change from usual color/sensation.      Carotid Artery Problems: Blockage  The blood carries oxygen and nutrients throughout the body. The carotid arteries are large blood vessels that carry blood to the brain. Certain health problems can make the inside of the carotid arteries narrow and rough. Over time, this damage increases the chances of having a stroke (sudden loss of brain function).  Open Carotid Arteries      A healthy carotid artery lets blood flow easily to the brain.   In a healthy carotid artery, the inside of the artery is open. The lining of the artery wall is also smooth. This lets blood flow freely from the heart to the brain. The brain gets all the oxygen and nutrients it needs to function well.  Narrowed Carotid Arteries  Health factors such as high blood pressure, smoking, and diabetes can damage artery walls and make them rough. This allows cholesterol and other particles in the blood to stick to the artery walls and form plaque (fatty deposits). As the plaque builds up, it can narrow the artery. Blood may also collect on the plaque and form blood clots.  How  a Stroke Can Occur  A stroke can occur when plaque in the carotid artery ruptures. This can allow small pieces of plaque to break off into the bloodstream. At the same time, rupture can produce more blood clots. Fragments of plaque and tiny blood clots (called emboli) can flow in the blood until they get stuck in a small blood vessel in the brain. This blocks blood flow to a portion of the brain and causes a stroke.     Emboli can enter the bloodstream and travel to the brain. Brain tissue is damaged when emboli block arteries in the brain.      [address], [address], [place], [place] [address]. All rights reserved. This information is not intended as a substitute for professional medical care. Always follow your healthcare professional's instructions.      Carotid Artery Problems: Stroke  The carotid arteries are large blood vessels that carry blood to the brain. When these arteries are healthy, the brain gets all the oxygen and nutrients it needs to function well. If the carotid arteries are damaged, however, it can greatly increase your chances of stroke. This is a sudden loss of brain function caused by a lack of blood flow.     Emboli can enter the bloodstream and travel to the brain. Brain tissue is damaged when emboli block arteries in the brain.   How Artery Damage Can Lead to Stroke  In a healthy carotid artery, the inside of the artery wall is smooth and open. But health problems such as high blood pressure can damage the artery wall and make  it rough. This allows plaque (fatty deposits) to build up in the artery wall. Blood clots may also form on the plaque. If pieces of plaque or blood clot (called emboli) break off, they can flow in the blood until they get stuck in a small blood vessel in the brain. This blocks blood flow to a portion of the brain, causing a stroke.  Symptoms of Stroke  Below are common symptoms of stroke. Call 911 right away if you have any of these symptoms!  Prompt treatment for a stroke is vital. The longer you wait to get medical help, the more damage a stroke can do.   Sudden numbness or weakness of the face, arm, or leg, especially on one side of the body   Sudden confusion, trouble speaking, or trouble understanding   Sudden trouble seeing in one or both eyes   Sudden trouble walking, dizziness, or loss of balance or coordination   Sudden, severe headache with no known cause  TIA (Transient Ischemic Attack)  A TIA is a "ministroke." It's a serious warning sign of a larger stroke. TIAs occur when an artery supplying the brain is temporarily blocked. This causes stroke symptoms that last from a few seconds to a few hours. NEVER ignore any stroke symptoms. Call 911 right away!   [address], [place], [place] [address]. All rights reserved. This information is not intended as a substitute for professional medical care. Always follow your healthcare professional's instructions.

## 2013-06-16 NOTE — Progress Notes (Signed)
Vital signs stable. dopamin drip decrease to 1 mcgkg/min. Continue to monitor.

## 2013-06-16 NOTE — Discharge Summary -  Nursing (Signed)
S/p angioplasty and stent placement to right carotid artery 06/15/13. Pt states that her neck is sore at times and is relieved with Ibuprofen PO. Neurological status intact. Denies dizziness and blurred vision. Pt ambulated without issue or difficulty. RFA puncture site remains intact with no oozing or hematoma. Denies pain at the puncture site.     Plavix 300 mg PO x 1 given today per order.     Explained discharge instructions to pt regarding follow-up, medication usage, activity limitations post procedure, and puncture site care. Pt verbalized understanding. Copy of instructions given. Tele box and IV removed. Husband transported pt home.

## 2013-06-16 NOTE — Progress Notes (Signed)
Vital signs stable. dopamin drip decrease to 2 mcg/kg/min. Continue to monitor.

## 2013-06-16 NOTE — Plan of Care (Signed)
Problem: Hemodynamic Status: Cardiac  Goal: Stable vital signs and fluid balance  Outcome: Progressing  Vital signs stable. Normal sinus rhythm on monitor.right groin dressing clean dry and intact. Blood pressure stable. dopamin drip discontinue. mortrim 600 mg po given this am for c/o neck discomfort.not as severe as yesterday as per patient. Ambulated several times to bathroom  without  Problems. Continue plan of care.

## 2013-06-16 NOTE — Progress Notes (Signed)
Vital signs remains stable. Ambulated to bathroom without c/o lightheaded. Neck discomfort resolve. Continue plan of care.

## 2013-06-16 NOTE — Discharge Summary (Signed)
DISCHARGE NOTE    Date Time: 06/16/2013 11:15 AM  Patient Name: Teresa Briggs,Teresa Briggs        Assessment/Plan:   S/P R ICA Stent   Her BP is improved,    Plavix 300 mg x 1 today prior to discharge   Hold Lisinopril until pt follows up in one week due to low  BP post stent   Protonix 40 mg daily - pt c/o heart burn like pain since taking Plavix    Subjective:   No dizziness or visual disturbance    Objective:     Physical Exam:      Temp (24hrs), Avg:97.9 F (36.6 C), Min:97.5 F (36.4 C), Max:98.4 F (36.9 C)      Temp:  [97.5 F (36.4 C)-98.4 F (36.9 C)] 97.9 F (36.6 C)  Heart Rate:  [50-70] 64   Resp Rate:  [16-18] 16   BP: (75-135)/(37-69) 108/56 mmHg    Patient Vitals for the past 24 hrs:   BP Temp Temp src Pulse Resp SpO2   06/16/13 0800 108/56 mmHg 97.9 F (36.6 C) Oral 64  16  96 %   06/16/13 0640 118/69 mmHg - - 54  18  -   06/16/13 0600 108/57 mmHg - - 56  18  -   06/16/13 0435 105/55 mmHg - - 56  18  -   06/16/13 0302 100/51 mmHg - - 70  18  -   06/16/13 0155 106/54 mmHg - - 64  18  -   06/16/13 0043 87/49 mmHg - - 60  18  -   06/15/13 2304 90/51 mmHg - - 58  18  -   06/15/13 2229 99/51 mmHg - - 66  18  -   06/15/13 2043 135/63 mmHg - - 58  18  -   06/15/13 1942 102/54 mmHg 98.4 F (36.9 C) - 54  18  94 %   06/15/13 1810 94/51 mmHg - - 54  - 97 %   06/15/13 1805 - - - 54  - 93 %   06/15/13 1800 91/55 mmHg - - 54  - 96 %   06/15/13 1755 - - - 56  - 96 %   06/15/13 1750 - - - 54  - 97 %   06/15/13 1745 86/47 mmHg - - 52  - 97 %   06/15/13 1740 - - - 50  - 95 %   06/15/13 1735 78/41 mmHg - - 56  - 94 %   06/15/13 1730 80/45 mmHg - - 52  - 96 %   06/15/13 1725 - - - 52  - 96 %   06/15/13 1720 - - - 52  - 95 %   06/15/13 1715 77/39 mmHg - - 52  - 95 %   06/15/13 1710 - - - 52  - 95 %   06/15/13 1705 - - - 52  - 94 %   06/15/13 1700 80/44 mmHg - - 58  - 95 %   06/15/13 1655 - - - 52  - 94 %   06/15/13 1650 - - - 52  - 93 %   06/15/13 1645 82/45 mmHg - - 52  - 93 %   06/15/13 1640 - - - 52  - 93 %    06/15/13 1635 - - - 50  - 93 %   06/15/13 1630 84/46 mmHg - - 54  - 94 %   06/15/13 1625 81/44 mmHg - - 52  -  92 %   06/15/13 1620 - - - 54  - 94 %   06/15/13 1615 75/37 mmHg - - 54  - 94 %   06/15/13 1610 - - - 54  - 95 %   06/15/13 1605 - - - 54  - 94 %   06/15/13 1600 81/38 mmHg - - 52  - 96 %   06/15/13 1555 - - - 52  - 96 %   06/15/13 1550 - - - 52  - 94 %   06/15/13 1545 91/55 mmHg - - 54  - 96 %   06/15/13 1540 - - - 54  - 94 %   06/15/13 1535 - - - 52  - 93 %   06/15/13 1530 86/51 mmHg - - 54  - 94 %   06/15/13 1525 86/51 mmHg - - 50  - 92 %   06/15/13 1520 - - - 52  - 94 %   06/15/13 1515 77/40 mmHg - - 52  - 94 %   06/15/13 1510 - - - 52  - 92 %   06/15/13 1506 - - - 52  - 93 %   06/15/13 1505 - - - 52  - 93 %   06/15/13 1500 82/46 mmHg - - 60  - 95 %   06/15/13 1455 85/43 mmHg - - 54  - 95 %   06/15/13 1450 - - - 52  - 94 %   06/15/13 1445 98/50 mmHg - - 54  - 96 %   06/15/13 1440 - - - 54  - 94 %   06/15/13 1435 - - - 54  - 93 %   06/15/13 1430 103/54 mmHg - - 54  - 94 %   06/15/13 1425 - - - 56  - 93 %   06/15/13 1420 - - - 56  - 94 %   06/15/13 1415 110/56 mmHg - - 56  - 95 %   06/15/13 1410 - - - 56  - 96 %   06/15/13 1405 - - - 60  - 94 %   06/15/13 1400 106/56 mmHg - - 58  - 95 %   06/15/13 1355 - - - 60  - 95 %   06/15/13 1350 - - - 56  - 92 %   06/15/13 1348 - 97.5 F (36.4 C) Oral - - -   06/15/13 1345 99/53 mmHg - - 60  - 95 %   06/15/13 1340 105/53 mmHg - - 64  - -       Weight change:   Intake and Output Summary (Last 24 hours) at Date Time  No intake or output data in the 24 hours ending 06/16/13 1115    General appearance - alert, well appearing, and in no distress  Neck - supple, no bruit  Chest - clear to auscultation, no wheezes, rales or rhonchi, symmetric air entry  Heart - normal rate and regular rhythm, S1 and S2 normal, no murmurs noted  Abdomen - soft, nontender, nondistended, no masses or organomegaly  Extremities - DP pulses 2+, no pedal edema, no clubbing or cyanosis, Rt groin  without bruit orhematoma          EKG/TELE;  SR/LBBB        Rads:   Radiological Procedure reviewed.      Labs:       Lab 06/16/13 0613   WBC 5.52   HGB 12.2   HCT 37.2  PLT 190       No results found for this basename: TROPONIN:3,TROPONINT:3,ISTATTROPONI:3,CK:3,CKMB:3 in the last 168 hours  No results found for this basename: DDIMER       No results found for this basename: BNP       No results found for this basename: HGBA1C        Lab 06/16/13 0613   GLU 93   BUN 13.0   CREAT 0.7   CA 9.3   NA 141   K 4.0   CL 111*   CO2 22   ALB --   PHOS --   MG --   AST --   ALT --   TSH --       No results found for this basename: CHOL,TRIG,HDL,LDL in the last 168 hours       Medications:     Current Facility-Administered Medications   Medication Dose Route Frequency   . atorvastatin  20 mg Oral Daily   . [COMPLETED] atropine       . [COMPLETED] clopidogrel  300 mg Oral Once   . [START ON 06/17/2013] clopidogrel  75 mg Oral Daily   . [COMPLETED] DOPamine       . famotidine  20 mg Oral Q12H SCH   . [COMPLETED] fentaNYL       . [COMPLETED] fentaNYL       . [COMPLETED] heparin (porcine)       . [COMPLETED] heparin       . levothyroxine  137 mcg Oral Daily at 0600   . [COMPLETED] lidocaine       . [COMPLETED] midazolam       . [COMPLETED] midazolam       . pantoprazole  40 mg Oral Daily   . [DISCONTINUED] clopidogrel  75 mg Oral Daily                 Signed by:  Yetta Glassman, NP  Frederic Jericho, Md, [facility]

## 2013-10-06 DIAGNOSIS — I679 Cerebrovascular disease, unspecified: Secondary | ICD-10-CM | POA: Insufficient documentation

## 2013-10-08 LAB — ECG 12-LEAD
Atrial Rate: 59 {beats}/min
P Axis: 45 degrees
P-R Interval: 198 ms
Q-T Interval: 524 ms
QRS Duration: 140 ms
QTC Calculation (Bezet): 518 ms
R Axis: 82 degrees
T Axis: 74 degrees
Ventricular Rate: 59 {beats}/min

## 2014-06-08 ENCOUNTER — Ambulatory Visit (INDEPENDENT_AMBULATORY_CARE_PROVIDER_SITE_OTHER): Payer: No Typology Code available for payment source

## 2014-06-09 ENCOUNTER — Encounter (INDEPENDENT_AMBULATORY_CARE_PROVIDER_SITE_OTHER): Payer: Self-pay | Admitting: Cardiovascular Disease

## 2014-06-09 ENCOUNTER — Telehealth (INDEPENDENT_AMBULATORY_CARE_PROVIDER_SITE_OTHER): Payer: Self-pay | Admitting: Cardiovascular Disease

## 2014-06-09 ENCOUNTER — Ambulatory Visit (INDEPENDENT_AMBULATORY_CARE_PROVIDER_SITE_OTHER): Payer: BC Managed Care – PPO

## 2014-06-09 ENCOUNTER — Ambulatory Visit (INDEPENDENT_AMBULATORY_CARE_PROVIDER_SITE_OTHER): Payer: BC Managed Care – PPO | Admitting: Cardiovascular Disease

## 2014-06-09 DIAGNOSIS — R002 Palpitations: Secondary | ICD-10-CM

## 2014-06-09 NOTE — Telephone Encounter (Signed)
Please call pt on [phone] to schedule 48 hour holter per HT

## 2014-06-09 NOTE — Progress Notes (Signed)
[facility]     No chief complaint on file.        History of Present Illness     64 year old female who is here for follow-up of her research protocol.  She has coronary artery disease and status post coronary artery bypass graft as well as stent placement and is usually being followed by Dr. Richrd Sox.  She states that she has been having palpitations associated with cough almost every day while she was here being seen for research follow-up.  There is no associated chest pain and also she has no limitations in her daily activities.      Past Medical History     Past Medical History   Diagnosis Date   . Disorder of thyroid      hypothyroid   . CAD (coronary artery disease)        Past Surgical History     Past Surgical History   Procedure Laterality Date   . Cardiac surgery  07/2002     cabg x 2   . Tonsillectomy         Family History     History reviewed. No pertinent family history.    Social History     History     Social History   . Marital Status: Married     Spouse Name: N/A     Number of Children: N/A   . Years of Education: N/A     Occupational History   . Not on file.     Social History Main Topics   . Smoking status: Never Smoker    . Smokeless tobacco: Not on file   . Alcohol Use: Not on file   . Drug Use: Not on file   . Sexual Activity: Not on file     Other Topics Concern   . Not on file     Social History Narrative       Allergies     No Known Allergies    Medications     Current Outpatient Prescriptions   Medication Sig Dispense Refill   . atorvastatin (LIPITOR) 20 MG tablet Take 1 tablet (20 mg total) by mouth daily. 30 tablet 0   . clopidogrel (PLAVIX) 75 mg tablet Take 75 mg by mouth daily.     Marland Kitchen levothyroxine (SYNTHROID, LEVOTHROID) 150 MCG tablet Take 137 mcg by mouth Once a day at 6:00am.     . pantoprazole (PROTONIX) 40 MG tablet Take 1 tablet (40 mg total) by mouth daily. 30 tablet 0   . zolpidem (AMBIEN CR) 12.5 MG CR tablet Take 12.5 mg by mouth nightly as needed.       No current  facility-administered medications for this visit.         Review of Systems     Constitutional: Negative for fevers and chills  Skin: No rash or lesions  Respiratory: Negative for cough, wheezing, or hemoptysis  Cardiovascular: as per HPI  Gastrointestinal: Negative for abdominal pain, nausea, vomiting and diarrhea  Musculoskeletal:  No arthritic symptoms  Genitourinary: Negative for dysuria  Otherwise 10 point review of systems is negative.      Physical Exam     There were no vitals filed for this visit.    There is no weight on file to calculate BMI.    General:  Patient appears their stated age, well-nourished.  Alert and in no apparent distress.  Eyes: No conjunctivitis, no purulent discharge, no lid lag  ENT:  Hearing grossly  intact, Nares patent bilaterally, Lips moist, color appropriate for race.  Respiratory: Clear to auscultation and percussion throughout. Respiratory effort unlabored, chest expansion symmetric.    Cardio: Regular rate and rhythm. Normal S1/S2 No carotid bruits or thrills, no JVD.  Extremities: warm, pulses 2+, no peripheral edema  GI: Not done  Skin: Color appropriate for race, Skin warm, dry, and intact  Psychiatric: Good insight and judgment, oriented to person, place, and time    Labs     CBC:   WBC   Date/Time Value Ref Range Status   06/16/2013 06:13 AM 5.52 3.50 - 10.80 x10 3/uL Final     RBC   Date/Time Value Ref Range Status   06/16/2013 06:13 AM 4.17* 4.20 - 5.40 x10 6/uL Final     HGB   Date/Time Value Ref Range Status   06/16/2013 06:13 AM 12.2 12.0 - 16.0 g/dL Final     HEMATOCRIT   Date/Time Value Ref Range Status   06/16/2013 06:13 AM 37.2 37.0 - 47.0 % Final     MCV   Date/Time Value Ref Range Status   06/16/2013 06:13 AM 89.2 80.0 - 100.0 fL Final     MCHC   Date/Time Value Ref Range Status   06/16/2013 06:13 AM 32.8 32.0 - 36.0 g/dL Final     RDW   Date/Time Value Ref Range Status   06/16/2013 06:13 AM 13 12 - 15 % Final     PLATELETS   Date/Time Value Ref Range Status    06/16/2013 06:13 AM 190 140 - 400 x10 3/uL Final       CMP:   SODIUM   Date/Time Value Ref Range Status   06/16/2013 06:13 AM 141 136 - 145 mEq/L Final     POTASSIUM   Date/Time Value Ref Range Status   06/16/2013 06:13 AM 4.0 3.5 - 5.1 mEq/L Final     CHLORIDE   Date/Time Value Ref Range Status   06/16/2013 06:13 AM 111* 98 - 107 mEq/L Final     CO2   Date/Time Value Ref Range Status   06/16/2013 06:13 AM 22 22 - 29 mEq/L Final     GLUCOSE   Date/Time Value Ref Range Status   06/16/2013 06:13 AM 93 70 - 100 mg/dL Final     Comment:     Interpretive Data for Adult Female and Female Population  Indeterminate Range:  100-125 mg/dL  Equal to or greater than 126 mg/dL meets the ADA  guidelines for Diabetes Mellitus diagnosis if symptoms  are present and confirmed by repeat testing.  Random (Non-Fasting)Interpretive Data (Adults):  Equal to or greater than 200 mg/dL meets the ADA  guidelines for Diabetes Mellitus diagnosis if symptoms  are present and confirmed by Fasting Glucose or GTT.     BUN   Date/Time Value Ref Range Status   06/16/2013 06:13 AM 13.0 7.0 - 19.0 mg/dL Final       Lipid Panel No results found for: CHOL, TRIG, HDL      EKG     I have reviewed and interpreted the EKG.  The EKG is significant for sinus rhythm with left bundle-branch block    Assessment and Plan       1. Palpitations  - Holter 48 hr; Future    Daily.  I am not sure if she has atrial arrhythmias.  I will proceed with a Holter monitor for workup and she will see her cardiologist, Dr. Richrd Sox afterwards to go over the results.  Patient to follow up in after Holter monitor      Silver Huguenin, M.D., [facility], [facility]  Director, Chronic Total Occlusion.  and Complex Coronary Interventions.  [facility]

## 2014-06-10 ENCOUNTER — Encounter (INDEPENDENT_AMBULATORY_CARE_PROVIDER_SITE_OTHER): Payer: Self-pay | Admitting: Cardiovascular Disease

## 2014-06-16 NOTE — Telephone Encounter (Signed)
Appt made 2/10 in [place]

## 2014-07-01 ENCOUNTER — Other Ambulatory Visit (INDEPENDENT_AMBULATORY_CARE_PROVIDER_SITE_OTHER): Payer: Self-pay | Admitting: Cardiovascular Disease

## 2014-07-01 DIAGNOSIS — E7800 Pure hypercholesterolemia, unspecified: Secondary | ICD-10-CM

## 2014-07-02 ENCOUNTER — Encounter (INDEPENDENT_AMBULATORY_CARE_PROVIDER_SITE_OTHER): Payer: Self-pay | Admitting: Cardiovascular Disease

## 2014-07-07 ENCOUNTER — Encounter (INDEPENDENT_AMBULATORY_CARE_PROVIDER_SITE_OTHER): Payer: BC Managed Care – PPO

## 2014-07-15 ENCOUNTER — Ambulatory Visit (INDEPENDENT_AMBULATORY_CARE_PROVIDER_SITE_OTHER): Payer: BC Managed Care – PPO | Admitting: Cardiovascular Disease

## 2014-07-15 ENCOUNTER — Encounter (INDEPENDENT_AMBULATORY_CARE_PROVIDER_SITE_OTHER): Payer: Self-pay | Admitting: Cardiovascular Disease

## 2014-07-15 VITALS — BP 160/90 | HR 72 | Ht 69.0 in | Wt 204.0 lb

## 2014-07-15 DIAGNOSIS — I209 Angina pectoris, unspecified: Secondary | ICD-10-CM | POA: Insufficient documentation

## 2014-07-15 DIAGNOSIS — E7800 Pure hypercholesterolemia, unspecified: Secondary | ICD-10-CM

## 2014-07-15 DIAGNOSIS — I6521 Occlusion and stenosis of right carotid artery: Secondary | ICD-10-CM

## 2014-07-15 DIAGNOSIS — D649 Anemia, unspecified: Secondary | ICD-10-CM | POA: Insufficient documentation

## 2014-07-15 DIAGNOSIS — R002 Palpitations: Secondary | ICD-10-CM

## 2014-07-15 DIAGNOSIS — I447 Left bundle-branch block, unspecified: Secondary | ICD-10-CM | POA: Insufficient documentation

## 2014-07-15 DIAGNOSIS — E039 Hypothyroidism, unspecified: Secondary | ICD-10-CM | POA: Insufficient documentation

## 2014-07-15 MED ORDER — METOPROLOL SUCCINATE ER 25 MG PO TB24
25.0000 mg | ORAL_TABLET | Freq: Every day | ORAL | Status: DC | PRN
Start: 2014-07-15 — End: 2015-06-02

## 2014-07-15 NOTE — Progress Notes (Signed)
[facility] [place] Progress Note - [place]    Referring Physician: Ratchford, Ferd Glassing, MD      HPI:   Teresa Briggs is a 64 y.o. female who presents for follow up visit regarding Palpitations    Palpitations  Patient complains of palpitations. The symptoms are moderate to severe, occur intermittently, and the time they last vary per episode. They tend to occur while at rest. Cardiac risk factors include: dyslipidemia and obesity (BMI >= 30 kg/m2). Aggravating factors: none. Alleviating factors: spontaneous. Associated symptoms: cough when palpitations are severe. Patient denies: chest pain and dizziness.      Assessment and Plan:    1. Palpitations  Her holter monitor shows PAC's and PVC's. We will give her a trial of Metoprolol. She can take this daily as needed. Her BP is elevated in the office today. We recommended she monitor her BP at home.    2. Stenosis of right carotid artery  S/P stent to right ICA 05/2013  We ordered a carotid US to follow up on her carotid stenosis.    3. Hypercholesteremia  Continue Statin therapy with the goal of achieving LDL < 70 mg/dL. She will have her lipid panel checked by Dr. Sharmon Revere in April.      Return in about 6 months (around 01/13/2015).      History:     Past Medical History   Diagnosis Date   . Disorder of thyroid      hypothyroid   . CAD (coronary artery disease)    . Coronary artery disease        No Known Allergies    Past Surgical History   Procedure Laterality Date   . Cardiac surgery  07/2002     cabg x 2   . Tonsillectomy         History     Social History   . Marital Status: Married     Spouse Name: N/A     Number of Children: N/A   . Years of Education: N/A     Occupational History   . Not on file.     Social History Main Topics   . Smoking status: Never Smoker    . Smokeless tobacco: Not on file   . Alcohol Use: Yes   . Drug Use: Not on file   . Sexual Activity: Not on file     Other Topics Concern   . Not on file     Social History  Narrative       Family History   Problem Relation Age of Onset   . Adopted: Yes       Review of Systems:  General: negative for - fever, malaise or night sweats  Respiratory: negative for - cough, orthopnea, shortness of breath or wheezing  Cardiovascular: positive for palpitations, negative for - chest pain, dyspnea, edema, irregular heartbeat, or syncope  Musculoskeletal: negative for - pain in leg or swelling in leg   Neurological: negative for - syncope, visual changes, weakness or headache    Current Outpatient Prescriptions   Medication Sig Dispense Refill   . atorvastatin (LIPITOR) 20 MG tablet Take 1 tablet by mouth  daily 90 tablet 3   . levothyroxine (SYNTHROID, LEVOTHROID) 150 MCG tablet Take 137 mcg by mouth Once a day at 6:00am.     . pantoprazole (PROTONIX) 40 MG tablet Take 1 tablet (40 mg total) by mouth daily. 30 tablet 0   . zolpidem (AMBIEN CR) 12.5 MG  CR tablet Take 12.5 mg by mouth nightly as needed.       No current facility-administered medications for this visit.       Physical Examination:  BP 160/90 mmHg  Pulse 72  Ht 1.753 m (5\' 9" )  Wt 92.534 kg (204 lb)  BMI 30.11 kg/m2      General Appearance:    Alert, cooperative, no distress, appears stated age   HEENT:   Normocephalic, Normal mucous membranes, No   Xanthomas, No      sclera discoloration   Neck:   Supple, symmetrical, no adenopathy;        thyroid:   no carotid bruit or JVD   Lungs:     Clear to auscultation bilaterally, respirations unlabored   Heart:    RRR, S1 and S2 normal, no murmur, rub or gallop   Abdomen:     Soft, non-tender, bowel sounds active all four quadrants,     no masses, no organomegaly   Extremities:   Extremities normal, atraumatic, no cyanosis or edema   Pulses:   2+ and symmetric all extremities   Skin:   Skin color, texture, turgor normal, no rashes or lesions   Neurologic:   CNII-XII intact. Normal strength, sensation and reflexes       throughout       Frederic Jericho, MD, [facility], [place]  [facility]  [place]  Assistant Clinical Professor of Medicine at   [facility]

## 2014-07-19 ENCOUNTER — Ambulatory Visit
Admission: RE | Admit: 2014-07-19 | Discharge: 2014-07-19 | Disposition: A | Discharge status: Home / Self Care | Payer: BC Managed Care – PPO | Source: Ambulatory Visit | Attending: Cardiovascular Disease | Admitting: Cardiovascular Disease

## 2014-07-19 DIAGNOSIS — I6521 Occlusion and stenosis of right carotid artery: Secondary | ICD-10-CM | POA: Insufficient documentation

## 2014-08-20 ENCOUNTER — Telehealth (INDEPENDENT_AMBULATORY_CARE_PROVIDER_SITE_OTHER): Payer: Self-pay

## 2014-08-20 NOTE — Telephone Encounter (Signed)
-----   Message from Frederic Jericho, MD sent at 07/25/2014  5:15 PM EST -----  Regarding: carotid u/s  Showed no significant carotid blockages  Stent is widely open

## 2014-08-20 NOTE — Telephone Encounter (Signed)
Left message to call back  

## 2014-08-24 NOTE — Telephone Encounter (Signed)
Pt returned call and is aware of results. Pt verbalized understanding.

## 2014-09-06 ENCOUNTER — Ambulatory Visit (INDEPENDENT_AMBULATORY_CARE_PROVIDER_SITE_OTHER): Payer: BC Managed Care – PPO

## 2014-09-07 ENCOUNTER — Ambulatory Visit (INDEPENDENT_AMBULATORY_CARE_PROVIDER_SITE_OTHER): Payer: BC Managed Care – PPO

## 2014-09-07 NOTE — Research Progress Note (Signed)
09/07/2014  1:55 PM     CAMELLIA [id number] charges to study:    Teresa Briggs is a 64 y.o. female participating in the CAMELLIA CV Outcomes Study and randomized to Belviq 10 mg bid (Lorcaserin Hydrochloride-Serotonin 2C receptor agonist) versus Placebo which was dispensed at today's visit.    Questionares and Patient Education was completed per research protocol according to the schedule of events.

## 2014-09-20 ENCOUNTER — Telehealth (INDEPENDENT_AMBULATORY_CARE_PROVIDER_SITE_OTHER): Payer: Self-pay

## 2014-09-20 NOTE — Telephone Encounter (Signed)
[facility] [phone]) called to advise that patient is scheduled for Colonoscopy with IV sedation on 11/25/2014 with Dr Emilio Aspen. They requesting Cardiac Clearance.    Patient was last seen on 07/15/2014. Please advise if patient may cleared for procedure and at what risk level?    Thank you.

## 2014-10-04 NOTE — Telephone Encounter (Signed)
Please see message below.    Please advise.     Thank you.

## 2014-10-12 NOTE — Telephone Encounter (Signed)
Please see message below regarding clearance.    Please advise.     Thank you

## 2014-10-20 ENCOUNTER — Encounter (INDEPENDENT_AMBULATORY_CARE_PROVIDER_SITE_OTHER): Payer: Self-pay

## 2014-10-20 NOTE — Telephone Encounter (Signed)
Clearance completed and faxed

## 2014-10-20 NOTE — Telephone Encounter (Signed)
Please send Clearance for patient, she is low risk for planned procedure from cardiac standpoint.     Thank you,   Dr. Richrd Sox

## 2014-11-18 ENCOUNTER — Encounter (INDEPENDENT_AMBULATORY_CARE_PROVIDER_SITE_OTHER): Payer: Self-pay | Admitting: Cardiovascular Disease

## 2014-11-18 ENCOUNTER — Ambulatory Visit (INDEPENDENT_AMBULATORY_CARE_PROVIDER_SITE_OTHER): Payer: BC Managed Care – PPO | Admitting: Cardiovascular Disease

## 2014-11-18 VITALS — BP 148/90 | HR 76 | Ht 69.0 in | Wt 209.8 lb

## 2014-11-18 DIAGNOSIS — Z01810 Encounter for preprocedural cardiovascular examination: Secondary | ICD-10-CM

## 2014-11-18 DIAGNOSIS — E7800 Pure hypercholesterolemia, unspecified: Secondary | ICD-10-CM

## 2014-11-18 DIAGNOSIS — R002 Palpitations: Secondary | ICD-10-CM

## 2014-11-18 DIAGNOSIS — I6521 Occlusion and stenosis of right carotid artery: Secondary | ICD-10-CM

## 2014-11-18 DIAGNOSIS — I25810 Atherosclerosis of coronary artery bypass graft(s) without angina pectoris: Secondary | ICD-10-CM

## 2014-11-18 MED ORDER — ROSUVASTATIN CALCIUM 5 MG PO TABS
5.0000 mg | ORAL_TABLET | Freq: Every day | ORAL | Status: DC
Start: 2014-11-18 — End: 2015-06-02

## 2014-11-18 NOTE — Progress Notes (Signed)
[facility] [place] Progress Note - [place]    Referring Physician: Ratchford, Ferd Glassing, MD      HPI:   Teresa Briggs is a 64 y.o. female who presents for follow up visit regarding Pre-op Exam and Palpitations    Palpitations  Patient complains of palpitations. The symptoms are moderate to severe, occur intermittently, and the time they last vary per episode. They tend to occur while at rest. Cardiac risk factors include: dyslipidemia and obesity (BMI >= 30 kg/m2). Aggravating factors: none. Alleviating factors: spontaneous. Associated symptoms: cough when palpitations are severe. Patient denies: chest pain and dizziness.      Assessment and Plan:    1. Preoperative cardiovascular examination  For colonoscopy 6/30 with [facility]. She is considered low risk for this procedure. She is also cleared to participate in water diving.    2. CAD involving bypass graft, S/P CABG x2 2005  She denies CP. Continue with current medical therapy.    3. Palpitations  Her holter monitor shows PAC's and PVC's. We gave her a trial of Metoprolol to take daily as needed. Her BP is elevated in the office today. We recommended she monitor her BP at home.    - ECG: I personally reviewed his ECG, which shows: Sinus rhythm with LBBB    2. Stenosis of right carotid artery, S/P stent to right ICA 05/2013  Her carotid US 07/19/14 showed patent stent in right ICA and normal left coronary artery.    3. Hypercholesteremia  Lipid Panel 08/2014 - TC: 152, TRG: 176, HDL: 48, LDL: 69  She reports muscle weakness. We recommended Co Q 10 and asked her to hold Statin therapy for a few weeks. She will then start Crestor 5 mg and take it a few times a week.      Return in about 6 months (around 05/20/2015).      History:     Past Medical History   Diagnosis Date   . Disorder of thyroid      hypothyroid   . Coronary artery disease    . Stented coronary artery    . Carotid stenosis        No Known Allergies    Past  Surgical History   Procedure Laterality Date   . Cardiac surgery  07/2002     cabg x 2   . Tonsillectomy     . Coronary artery bypass graft     . Coronary angioplasty         History     Social History   . Marital Status: Married     Spouse Name: N/A   . Number of Children: N/A   . Years of Education: N/A     Occupational History   . Not on file.     Social History Main Topics   . Smoking status: Never Smoker    . Smokeless tobacco: Not on file   . Alcohol Use: Yes   . Drug Use: Not on file   . Sexual Activity: Not on file     Other Topics Concern   . Not on file     Social History Narrative       Family History   Problem Relation Age of Onset   . Adopted: Yes       Review of Systems:  General: negative for - fever, malaise or night sweats  Respiratory: negative for - cough, orthopnea, shortness of breath or wheezing  Cardiovascular: positive for palpitations,  negative for - chest pain, dyspnea, edema, irregular heartbeat, or syncope  Musculoskeletal: negative for - pain in leg or swelling in leg   Neurological: negative for - syncope, visual changes, weakness or headache    Current Outpatient Prescriptions   Medication Sig Dispense Refill   . atorvastatin (LIPITOR) 20 MG tablet Take 1 tablet by mouth  daily 90 tablet 3   . levothyroxine (SYNTHROID, LEVOTHROID) 150 MCG tablet Take 137 mcg by mouth Once a day at 6:00am.     . metoprolol XL (TOPROL-XL) 25 MG 24 hr tablet Take 1 tablet (25 mg total) by mouth daily as needed. 30 tablet 5   . pantoprazole (PROTONIX) 40 MG tablet Take 1 tablet (40 mg total) by mouth daily. 30 tablet 0   . zolpidem (AMBIEN CR) 12.5 MG CR tablet Take 12.5 mg by mouth nightly as needed.       No current facility-administered medications for this visit.       Physical Examination:  BP 148/90 mmHg  Pulse 76  Ht 1.753 m (5\' 9" )  Wt 95.165 kg (209 lb 12.8 oz)  BMI 30.97 kg/m2      General Appearance:    Alert, cooperative, no distress, appears stated age   HEENT:   Normocephalic, Normal  mucous membranes, No   Xanthomas, No      sclera discoloration   Neck:   Supple, symmetrical, no adenopathy;        thyroid:   no carotid bruit or JVD   Lungs:     Clear to auscultation bilaterally, respirations unlabored   Heart:    RRR, S1 and S2 normal, no murmur, rub or gallop   Abdomen:     Soft, non-tender, bowel sounds active all four quadrants,     no masses, no organomegaly   Extremities:   Extremities normal, atraumatic, no cyanosis or edema   Pulses:   2+ and symmetric all extremities   Skin:   Skin color, texture, turgor normal, no rashes or lesions   Neurologic:   CNII-XII intact. Normal strength, sensation and reflexes       throughout       Frederic Jericho, MD, [facility], [place]  [facility] [place]  Assistant Clinical Professor of Medicine at   [facility]

## 2014-12-06 NOTE — Progress Notes (Deleted)
Copied from 11/18/2014 office note  HPI:   Teresa Briggs is a 65 y.o. female who presents for follow up visit regarding Pre-op Exam and Palpitations    Palpitations  Patient complains of palpitations. The symptoms are moderate to severe, occur intermittently, and the time they last vary per episode. They tend to occur while at rest. Cardiac risk factors include: dyslipidemia and obesity (BMI >= 30 kg/m2). Aggravating factors: none. Alleviating factors: spontaneous. Associated symptoms: cough when palpitations are severe. Patient denies: chest pain and dizziness.    Assessment and Plan:    1. Preoperative cardiovascular examination  For colonoscopy 6/30 with [facility]. She is considered low risk for this procedure. She is also cleared to participate in water diving.    2. CAD involving bypass graft, S/P CABG x2 2005  She denies CP. Continue with current medical therapy.    3. Palpitations  Her holter monitor shows PAC's and PVC's. We gave her a trial of Metoprolol to take daily as needed. Her BP is elevated in the office today. We recommended she monitor her BP at home.    - ECG: I personally reviewed his ECG, which shows: Sinus rhythm with LBBB    2. Stenosis of right carotid artery, S/P stent to right ICA 05/2013  Her carotid US 07/19/14 showed patent stent in right ICA and normal left coronary artery.    3. Hypercholesteremia  Lipid Panel 08/2014 - TC: 152, TRG: 176, HDL: 48, LDL: 69  She reports muscle weakness. We recommended Co Q 10 and asked her to hold Statin therapy for a few weeks. She will then start Crestor 5 mg and take it a few times a week.    [facility] Office  Progress Note      Referring Physician: Ratchford, Ferd Glassing, MD      HPI:   Teresa Briggs is a 64 y.o. female who presents for follow up visit regarding No chief complaint on file.    {Cardiovascular [id number]      Assessment and Plan:    There are no diagnoses linked to this  encounter.      No Follow-up on file.      History:     Past Medical History   Diagnosis Date   . Disorder of thyroid      hypothyroid   . Coronary artery disease    . Stented coronary artery    . Carotid stenosis        No Known Allergies    Past Surgical History   Procedure Laterality Date   . Cardiac surgery  07/2002     cabg x 2   . Tonsillectomy     . Coronary artery bypass graft     . Coronary angioplasty         History     Social History   . Marital Status: Married     Spouse Name: N/A   . Number of Children: N/A   . Years of Education: N/A     Occupational History   . Not on file.     Social History Main Topics   . Smoking status: Never Smoker    . Smokeless tobacco: Not on file   . Alcohol Use: Yes   . Drug Use: Not on file   . Sexual Activity: Not on file     Other Topics Concern   . Not on file     Social History Narrative  Family History   Problem Relation Age of Onset   . Adopted: Yes         Review of Systems:  General: negative for - fever, malaise or night sweats  Respiratory: negative for - cough, orthopnea, shortness of breath or wheezing  Cardiovascular: SEE HPI  Gastrointestinal: negative for - nausea, vomiting, or GI bleeding  Musculoskeletal: negative for - pain in leg or swelling in leg   Neurological: negative for - syncope, visual changes, weakness or headache    Current Outpatient Prescriptions   Medication Sig Dispense Refill   . levothyroxine (SYNTHROID, LEVOTHROID) 150 MCG tablet Take 137 mcg by mouth Once a day at 6:00am.     . metoprolol XL (TOPROL-XL) 25 MG 24 hr tablet Take 1 tablet (25 mg total) by mouth daily as needed. 30 tablet 5   . pantoprazole (PROTONIX) 40 MG tablet Take 1 tablet (40 mg total) by mouth daily. 30 tablet 0   . rosuvastatin (CRESTOR) 5 MG tablet Take 1 tablet (5 mg total) by mouth daily. 30 tablet 5   . zolpidem (AMBIEN CR) 12.5 MG CR tablet Take 12.5 mg by mouth nightly as needed.       No current facility-administered medications for this visit.          Physical Examination:  There were no vitals taken for this visit.      General Appearance:    Alert, cooperative, no distress, appears stated age   HEENT:   Normocephalic, Normal mucous membranes, No   Xanthomas, No      sclera discoloration   Neck:   Supple, symmetrical, no adenopathy;        thyroid:   no carotid bruit or JVD   Lungs:     Clear to auscultation bilaterally, respirations unlabored   Heart:    RRR, S1 and S2 normal, no murmur, rub or gallop   Abdomen:     Soft, non-tender, bowel sounds active all four quadrants,     no masses, no organomegaly   Extremities:   Extremities normal, atraumatic, no cyanosis or edema   Pulses:   2+ and symmetric all extremities   Skin:   Skin color, texture, turgor normal, no rashes or lesions   Neurologic:   CNII-XII intact. Normal strength, sensation and reflexes       throughout       Frederic Jericho, MD, [facility], [place]  [facility] [place]  Assistant Clinical Professor of Medicine at   [facility]

## 2014-12-09 ENCOUNTER — Ambulatory Visit (INDEPENDENT_AMBULATORY_CARE_PROVIDER_SITE_OTHER): Payer: BC Managed Care – PPO

## 2014-12-09 ENCOUNTER — Encounter (INDEPENDENT_AMBULATORY_CARE_PROVIDER_SITE_OTHER): Payer: Self-pay | Admitting: Cardiovascular Disease

## 2014-12-09 ENCOUNTER — Encounter (INDEPENDENT_AMBULATORY_CARE_PROVIDER_SITE_OTHER): Payer: BC Managed Care – PPO | Admitting: Cardiovascular Disease

## 2014-12-09 NOTE — Research Progress Note (Signed)
12/09/2014  10:58 AM     CAMELLIA [id number] charges to study:    Teresa Briggs is a 64 y.o. female participating in the CAMELLIA CV Outcomes Study and randomized to Belviq 10 mg bid (Lorcaserin Hydrochloride-Serotonin 2C receptor agonist) versus Placebo which was dispensed at today's visit.    Questionares and Patient Education was completed per research protocol according to the schedule of events.    Sponsor labs performed.    Study Title: CAMELLIA     [name], was evaluated for participation in protocol # [id number]. Prior to signing the informed consent form, the purpose, procedures, risks, and benefits of the trial were explained andample time was given for consideration. Alternatives to participation in the protocolwere outlined and the patient/legally authorized representative was given theopportunity to ask questions, which were satisfactorily answered.The patient/legally authorized representative signed consent on 12/09/2014  at 10:58 AM and was given a signed copy of the consent for his/her files.    Consent was signed prior to starting any protocol treatment or procedure. The  patient/legal authorized representative agrees to comply with the requirements of the  study. Eligibility will be determined after completion of protocol screening  Requirements.    The patient/legal authorized representative was informed of the unknown risks to an  unborn child and advised of reliable birth control methods that are acceptable for the  study requirements to prevent pregnancy or impregnating a partner while on study.  X Yes ? No ? N/A  Notes: NONE  Signature of Consenter: Una Yeomans Date: 12/09/2014

## 2014-12-09 NOTE — Progress Notes (Signed)
This encounter was created in error - please disregard.

## 2014-12-28 NOTE — Procedures (Signed)
A user error has taken place: encounter opened in error, closed for administrative reasons.

## 2015-01-03 ENCOUNTER — Encounter (INDEPENDENT_AMBULATORY_CARE_PROVIDER_SITE_OTHER): Payer: Self-pay

## 2015-04-05 ENCOUNTER — Encounter (INDEPENDENT_AMBULATORY_CARE_PROVIDER_SITE_OTHER): Payer: Self-pay | Admitting: Cardiovascular Disease

## 2015-04-12 ENCOUNTER — Ambulatory Visit (INDEPENDENT_AMBULATORY_CARE_PROVIDER_SITE_OTHER): Payer: BC Managed Care – PPO

## 2015-04-12 NOTE — Progress Notes (Signed)
Patient is in [place] CV Outcomes Study. She is on drug holiday and came today to drop off her study  bottles.

## 2015-05-31 DIAGNOSIS — I2510 Atherosclerotic heart disease of native coronary artery without angina pectoris: Secondary | ICD-10-CM | POA: Insufficient documentation

## 2015-05-31 DIAGNOSIS — E7800 Pure hypercholesterolemia, unspecified: Secondary | ICD-10-CM | POA: Insufficient documentation

## 2015-05-31 NOTE — Progress Notes (Signed)
[facility] Cardiology   [place] Office  Progress Note      Referring Physician: Ratchford, Ferd Glassing, MD         HPI:   Teresa Briggs is a 65 y.o. female who presents for follow up visit regarding Palpitations  She is reporting feeling extremely heavy palpitations, which she stated happened around the same time last year.  The symptoms are moderate to severe, occur intermittently, and the time they last vary per episode. They tend to occur while at rest. Cardiac risk factors include: dyslipidemia and obesity (BMI >= 30 kg/m2). Aggravating factors: none. Alleviating factors: spontaneous. Patient denies: chest pain and dizziness.    Assessment and Plan:  1. Palpitations  Reports having a squeezing sensations with occasional heartbeats palpitations.  Reports that it is not painful but happens multiple times a day and has no exertional component.  She reports taking metoprolol daily to help with her palpitations.    - ECG 12 lead (Today): Normal sinus rhythm, left bundle branch block (unchanged)  - metoprolol XL (TOPROL-XL) 25 MG 24 hr tablet; Take 1 tablet (25 mg total) by mouth daily.  Dispense: 90 tablet; Refill: 3    2. CAD involoving bypass graft, S/P CABG x2 2005  She denies chest pain. Continue with current medical therapy.  Due to her left bundle branch block she is not a candidate for treadmill testing.  She had side effects from pharmacological stress test before I assume this was adenosine.  I suggested in the future she should consider CT coronary angiography for evaluation of her coronary arteries.      3. Stenosis of right carotid artery  Her carotid US 07/19/14 showed patent stent in right ICA and normal left coronary artery.  - US Carotid Doppler Bilateral; Future    4. Hypercholesterolemia  Lipid Panel 09/02/2014 - TC: 152, TRG: 176, HDL: 48, LDL: 69  She has not had repeat labs since last office visit. She reports muscle weakness when taking the Crestor and has discontinued taking at which point  her symptoms resolved.     Recommended to evaluate her next lipid panel off of statin therapy to decide whether she needs any pharmacological therapy.  With my experience patient should be able to tolerate low dose Crestor 2 times a week.    5. LBBB (left bundle branch block)  EKG reveals LBBB which was present on previous EKG.  No specific evaluation needed for this.    Return in about 6 months (around 11/30/2015).      Previous Cardiac Testing:    Stent Carotid w/ protection 06/11/2013: 1. Severe right internal carotid artery ostial stenosis in the 80% to 90%  Range.  2. Mild left internal carotid artery stenosis.  3. Successful carotid stent placement with distal protection device.    US Carotid Duplex 07/19/2014: Patent stent in right ICA.  Normal left carotid artery.    History:     Past Medical History   Diagnosis Date   . Disorder of thyroid      hypothyroid   . Coronary artery disease    . Stented coronary artery    . Carotid stenosis        No Known Allergies    Past Surgical History   Procedure Laterality Date   . Cardiac surgery  07/2002     cabg x 2   . Tonsillectomy     . Coronary artery bypass graft     . Coronary angioplasty  Social History     Social History   . Marital Status: Married     Spouse Name: N/A   . Number of Children: N/A   . Years of Education: N/A     Occupational History   . Not on file.     Social History Main Topics   . Smoking status: Never Smoker    . Smokeless tobacco: Never Used   . Alcohol Use: 0.0 oz/week     0 Standard drinks or equivalent per week   . Drug Use: No   . Sexual Activity: Not on file     Other Topics Concern   . Not on file     Social History Narrative       Family History   Problem Relation Age of Onset   . Adopted: Yes         Review of Systems:  General: negative for - fever, malaise or night sweats  Respiratory: negative for - cough, orthopnea, shortness of breath or wheezing  Cardiovascular: SEE HPI  Gastrointestinal: negative for - nausea, vomiting,  or GI bleeding  Musculoskeletal: negative for - pain in leg or swelling in leg   Neurological: negative for - syncope, visual changes, weakness or headache    Current Outpatient Prescriptions   Medication Sig Dispense Refill   . levothyroxine (SYNTHROID, LEVOTHROID) 150 MCG tablet Take 137 mcg by mouth Once a day at 6:00am.     . metoprolol XL (TOPROL-XL) 25 MG 24 hr tablet Take 1 tablet (25 mg total) by mouth daily. 90 tablet 3   . zolpidem (AMBIEN CR) 12.5 MG CR tablet Take 12.5 mg by mouth nightly as needed.       No current facility-administered medications for this visit.         Physical Examination:  BP 148/98 mmHg  Pulse 68  Ht 1.753 m (5\' 9" )  Wt 93.895 kg (207 lb)  BMI 30.55 kg/m2      General Appearance:    Alert, cooperative, no distress, appears stated age   HEENT:   Normocephalic, Normal mucous membranes, No   Xanthomas, No      sclera discoloration   Neck:   Supple, symmetrical, no adenopathy;        thyroid:   no carotid bruit or JVD   Lungs:     Clear to auscultation bilaterally, respirations unlabored   Heart:    RRR, S1 and S2 normal, no murmur, no rub or gallop   Abdomen:     Soft, non-tender, bowel sounds active all four quadrants,     no masses, no organomegaly   Extremities:   Extremities normal, atraumatic, no edema, no cyanosis,   Pulses:   2+ and symmetric all extremities   Skin:   Skin color, texture, turgor normal, no rashes or lesions   Neurologic:   CNII-XII grossly intact, no other focal sensory or motor deficits       Frederic Jericho, MD, [facility], [facility]  [place]  Assistant Clinical Professor of Medicine at   [facility]

## 2015-06-02 ENCOUNTER — Ambulatory Visit (INDEPENDENT_AMBULATORY_CARE_PROVIDER_SITE_OTHER): Payer: 59 | Admitting: Cardiovascular Disease

## 2015-06-02 ENCOUNTER — Ambulatory Visit (INDEPENDENT_AMBULATORY_CARE_PROVIDER_SITE_OTHER): Payer: Commercial Managed Care - POS

## 2015-06-02 ENCOUNTER — Encounter (INDEPENDENT_AMBULATORY_CARE_PROVIDER_SITE_OTHER): Payer: Self-pay | Admitting: Cardiovascular Disease

## 2015-06-02 VITALS — BP 148/98 | HR 68 | Ht 69.0 in | Wt 207.0 lb

## 2015-06-02 DIAGNOSIS — I6521 Occlusion and stenosis of right carotid artery: Secondary | ICD-10-CM

## 2015-06-02 DIAGNOSIS — I2510 Atherosclerotic heart disease of native coronary artery without angina pectoris: Secondary | ICD-10-CM

## 2015-06-02 DIAGNOSIS — E7800 Pure hypercholesterolemia, unspecified: Secondary | ICD-10-CM

## 2015-06-02 DIAGNOSIS — R002 Palpitations: Secondary | ICD-10-CM

## 2015-06-02 DIAGNOSIS — I447 Left bundle-branch block, unspecified: Secondary | ICD-10-CM

## 2015-06-02 MED ORDER — METOPROLOL SUCCINATE ER 25 MG PO TB24
25.0000 mg | ORAL_TABLET | Freq: Every day | ORAL | Status: DC
Start: 2015-06-02 — End: 2016-02-23

## 2015-06-02 NOTE — Progress Notes (Signed)
06/02/2015  3:49 PM    CAMELLIA 150-871 charges to study: no charges to pt    Teresa Briggs is a 65 y.o. female participating in the CAMELLIA CV Outcomes Study and randomized to Belviq 10 mg bid (Lorcaserin Hydrochloride-Serotonin 2C receptor agonist) versus Placebo. EOT conducted today during visit 10. Patient will now be followed via phone visits per regular scheduled visit windows.    Sponsor labs performed.

## 2015-06-06 ENCOUNTER — Ambulatory Visit (INDEPENDENT_AMBULATORY_CARE_PROVIDER_SITE_OTHER): Payer: 59 | Admitting: Cardiovascular Disease

## 2015-06-06 DIAGNOSIS — R002 Palpitations: Secondary | ICD-10-CM

## 2015-06-06 DIAGNOSIS — I447 Left bundle-branch block, unspecified: Secondary | ICD-10-CM

## 2015-06-08 NOTE — Procedures (Signed)
[facility] 48 HR Holter Report      PATIENT:  Teresa Briggs         MRN: [id number].  Gender: female.  DOB: 01-24-1951.  Age: 65 y.o.    Interpreting Physician: Renard Hamper, MD, MS, [facility], [place]   Primary Physician:  Ratchford, Ferd Glassing, MD  Primary Cardiologist: Frederic Jericho, MD, [facility], FSCAI    INDICATION:    1. Palpitations          DATA:  Date of service:  06/06/2015  Recording time:  48 hours  Quality:  fair   Technician:  Maryla Morrow, Non Invasive Cardiac Tech.  Comments:  N/A        Heart Rate    Min HR  55 bpm   Ave HR 72 bpm   Max HR 106 bpm   Pauses > 2.5 sec 0   Longest  0 seconds       Ventricular Ectopy  Supraventricular Ectopy   Total VE Beats 509 beat(s) Total SVE beats 34 beat(s)   Vent Runs 0 events Atrial runs 0 events   Longest 0 beats Longest 0 beats   Fastest 0 bpm Fastest 0 bpm     AF Burden 0%       FINDINGS:  Baseline rhythm:  Sinus rhythm    Arrhythmia:  Maryla Morrow, Non Invasive Cardiac Tech.    Symptoms reported: Patient did not report any symptoms  Symptom correlation with arrhythmia:  Patient did not record any symptoms    The patient is not scheduled for a follow up appointment.  The patient will be notified of results by nursing.    CONCLUSIONS:  1. Normal sinus rhythm.  2. Premature atrial contractions, rare.  3. Premature ventricular contractions, infrequent.      Interpreted and electronically signed by:  Renard Hamper, MD, MS, [facility], [facility]    [facility]

## 2015-06-12 DIAGNOSIS — R002 Palpitations: Secondary | ICD-10-CM

## 2015-06-15 ENCOUNTER — Telehealth (INDEPENDENT_AMBULATORY_CARE_PROVIDER_SITE_OTHER): Payer: Self-pay

## 2015-06-15 NOTE — Telephone Encounter (Signed)
LM on VM notifying pt of results.  OK per HIPAA.  V. Heidie Krall, LPN

## 2015-06-15 NOTE — Telephone Encounter (Signed)
-----   Message from Renard Hamper, MD sent at 06/12/2015 10:06 PM EST -----  Please notify pt of satisfactory result

## 2015-07-06 ENCOUNTER — Other Ambulatory Visit (INDEPENDENT_AMBULATORY_CARE_PROVIDER_SITE_OTHER): Payer: Self-pay

## 2015-07-07 ENCOUNTER — Ambulatory Visit (INDEPENDENT_AMBULATORY_CARE_PROVIDER_SITE_OTHER): Payer: 59

## 2015-07-07 DIAGNOSIS — I2510 Atherosclerotic heart disease of native coronary artery without angina pectoris: Secondary | ICD-10-CM

## 2015-07-11 ENCOUNTER — Telehealth (INDEPENDENT_AMBULATORY_CARE_PROVIDER_SITE_OTHER): Payer: Self-pay

## 2015-07-11 NOTE — Telephone Encounter (Signed)
-----   Message from Frederic Jericho, MD sent at 07/10/2015  8:30 PM EST -----  Regarding: Carotid U/s  Please contact the patient with the results    Carotid u/s was normal  Stented area look without any blockages

## 2015-07-11 NOTE — Telephone Encounter (Signed)
Patient has been advised of normal carotid results.

## 2015-07-12 ENCOUNTER — Encounter (INDEPENDENT_AMBULATORY_CARE_PROVIDER_SITE_OTHER): Payer: Self-pay | Admitting: Cardiovascular Disease

## 2015-08-04 ENCOUNTER — Ambulatory Visit (INDEPENDENT_AMBULATORY_CARE_PROVIDER_SITE_OTHER): Payer: 59 | Admitting: Cardiovascular Disease

## 2015-08-04 ENCOUNTER — Encounter (INDEPENDENT_AMBULATORY_CARE_PROVIDER_SITE_OTHER): Payer: Self-pay | Admitting: Cardiovascular Disease

## 2015-08-04 VITALS — BP 150/90 | HR 72 | Ht 69.0 in | Wt 205.0 lb

## 2015-08-04 DIAGNOSIS — I6521 Occlusion and stenosis of right carotid artery: Secondary | ICD-10-CM

## 2015-08-04 DIAGNOSIS — E7800 Pure hypercholesterolemia, unspecified: Secondary | ICD-10-CM

## 2015-08-04 DIAGNOSIS — R002 Palpitations: Secondary | ICD-10-CM

## 2015-08-04 DIAGNOSIS — I2510 Atherosclerotic heart disease of native coronary artery without angina pectoris: Secondary | ICD-10-CM

## 2015-08-04 MED ORDER — ROSUVASTATIN CALCIUM 5 MG PO TABS
5.0000 mg | ORAL_TABLET | Freq: Every day | ORAL | Status: DC
Start: 2015-08-04 — End: 2016-02-23

## 2015-08-04 NOTE — Progress Notes (Signed)
[facility] ([facility]) Cardiology Progress Note  Frederic Jericho, MD, [facility],   [address] , [place] [address]  Phone: [phone], Fax: [phone]      Referring Physician: Emeline General, MD    HPI:   Teresa Briggs is a 65 y.o. female who presents for follow up visit regarding Coronary Artery Disease  She denies any symptoms of chest pain, shortness of breath, palpitations, orthopnea, PND.  She is fairly active without any significant limitations.  She is compliant with her medications and denies any side effects to her current regimen.    Assessment and Plan:  1. CAD involoving bypass graft, S/P CABG x2 2005  She denies chest pain. Continue with current medical therapy.    2. Palpitations  Her symptoms are improved.  Her Holter in January revealed PVCs, we went over her results in detail.  She is taking metoprolol as needed.     3. Stenosis of right carotid artery, S/P stent to right ICA 05/2013  Her carotid doppler in February showed no changes.  We went over the results in detail.    4. Hypercholesteremia  Lipid Panel 09/02/2014 - TC: 152, TRG: 176, HDL: 48, LDL: 69  She is taking the Crestor three times weekly and is tolerating so far.  She has not had a repeat lipid yet.      Return in about 6 months (around 02/04/2016).      Previous Cardiac/ Vascular Testing:  Carotid Doppler 07/07/2015: 1. Patent stent in R internal Carotid artery 2. No significant carotid stenosis 3. Ante-grade bilateral vertebral flow 4. Compared to the prior study, there has been no significant change.    Holter 06/08/2015: 1. Normal sinus rhythm. 2. Premature atrial contractions, rare. 3. Premature ventricular contractions, infrequent.    Stent Carotid w/ protection 06/11/2013: 1. Severe right internal carotid artery ostial stenosis in the 80% to 90%  Range.  2. Mild left internal carotid artery stenosis.  3. Successful carotid stent placement with distal protection device.    History:     Past Medical History   Diagnosis  Date   . Disorder of thyroid      hypothyroid   . Coronary artery disease    . Stented coronary artery    . Carotid stenosis        No Known Allergies    Past Surgical History   Procedure Laterality Date   . Cardiac surgery  07/2002     cabg x 2   . Tonsillectomy     . Coronary artery bypass graft     . Coronary angioplasty         Social History     Social History   . Marital Status: Married     Spouse Name: N/A   . Number of Children: N/A   . Years of Education: N/A     Occupational History   . Not on file.     Social History Main Topics   . Smoking status: Never Smoker    . Smokeless tobacco: Never Used   . Alcohol Use: 0.0 oz/week     0 Standard drinks or equivalent per week   . Drug Use: No   . Sexual Activity: Not on file     Other Topics Concern   . Not on file     Social History Narrative       Family History   Problem Relation Age of Onset   . Adopted: Yes  Review of Systems:  General: negative for - fever, malaise or night sweats  Respiratory: negative for - cough, orthopnea, shortness of breath or wheezing  Cardiovascular: SEE HPI  Gastrointestinal: negative for - nausea, vomiting, or GI bleeding  Musculoskeletal: negative for - pain in leg or swelling in leg   Neurological: negative for - syncope, visual changes, weakness or headache    Current Outpatient Prescriptions   Medication Sig Dispense Refill   . aspirin 81 MG chewable tablet Chew by mouth.     . levothyroxine (SYNTHROID, LEVOTHROID) 150 MCG tablet Take 137 mcg by mouth Once a day at 6:00am.     . metoprolol XL (TOPROL-XL) 25 MG 24 hr tablet Take 1 tablet (25 mg total) by mouth daily. (Patient taking differently: Take 12.5 mg by mouth daily.   ) 90 tablet 3   . rosuvastatin (CRESTOR) 5 MG tablet Take 1 tablet (5 mg total) by mouth daily. 3 x's weekly 45 tablet 3   . zolpidem (AMBIEN CR) 12.5 MG CR tablet Take 12.5 mg by mouth nightly as needed.       No current facility-administered medications for this visit.         Physical  Examination:  BP 150/90 mmHg  Pulse 72  Ht 1.753 m (5\' 9" )  Wt 92.987 kg (205 lb)  BMI 30.26 kg/m2      General Appearance:    Alert, cooperative, no distress, appears stated age   HEENT:   Normocephalic, Normal mucous membranes, No   Xanthomas, No      sclera discoloration   Neck:   Supple, symmetrical, no adenopathy;        thyroid:   no carotid bruit or JVD   Lungs:     Clear to auscultation bilaterally, respirations unlabored   Heart:    RRR, S1 and S2 normal, no murmur, no rub or gallop   Abdomen:     Soft, non-tender, bowel sounds active all four quadrants,     no masses, no organomegaly   Extremities:   Extremities normal, atraumatic, no edema, no cyanosis,   Pulses:   2+ and symmetric all extremities   Skin:   Skin color, texture, turgor normal, no rashes or lesions   Neurologic:   CNII-XII grossly intact, no other focal sensory or motor deficits       Frederic Jericho, MD, [facility], [facility]  [place] [facility]  Assistant Clinical Professor of Medicine at   [facility]

## 2015-08-12 ENCOUNTER — Encounter (INDEPENDENT_AMBULATORY_CARE_PROVIDER_SITE_OTHER): Payer: Self-pay | Admitting: Cardiovascular Disease

## 2015-08-24 ENCOUNTER — Encounter (INDEPENDENT_AMBULATORY_CARE_PROVIDER_SITE_OTHER): Payer: Self-pay | Admitting: Cardiovascular Disease

## 2016-01-18 ENCOUNTER — Encounter (INDEPENDENT_AMBULATORY_CARE_PROVIDER_SITE_OTHER): Payer: Self-pay | Admitting: Cardiovascular Disease

## 2016-02-23 ENCOUNTER — Encounter (INDEPENDENT_AMBULATORY_CARE_PROVIDER_SITE_OTHER): Payer: Self-pay | Admitting: Cardiovascular Disease

## 2016-02-23 ENCOUNTER — Ambulatory Visit (INDEPENDENT_AMBULATORY_CARE_PROVIDER_SITE_OTHER): Payer: Commercial Managed Care - POS | Admitting: Cardiovascular Disease

## 2016-02-23 VITALS — BP 150/84 | HR 64 | Ht 69.0 in | Wt 188.4 lb

## 2016-02-23 DIAGNOSIS — I6521 Occlusion and stenosis of right carotid artery: Secondary | ICD-10-CM

## 2016-02-23 DIAGNOSIS — E7800 Pure hypercholesterolemia, unspecified: Secondary | ICD-10-CM

## 2016-02-23 DIAGNOSIS — I2510 Atherosclerotic heart disease of native coronary artery without angina pectoris: Secondary | ICD-10-CM

## 2016-02-23 DIAGNOSIS — R002 Palpitations: Secondary | ICD-10-CM

## 2016-02-23 MED ORDER — EZETIMIBE 10 MG PO TABS
10.0000 mg | ORAL_TABLET | Freq: Every day | ORAL | 5 refills | Status: AC
Start: 2016-02-23 — End: ?

## 2016-02-23 NOTE — Progress Notes (Signed)
[facility] Cardiology Progress Note  Frederic Jericho, MD, [facility], FSCAI  [address] , [place] [address]  Phone: [phone], Fax: [phone]      Referring Physician: Ratchford, Ferd Glassing, MD    HPI:   Teresa Briggs is a 65 y.o. female who presents for follow up visit regarding Coronary Artery Disease (Pt is here for 49m f/u. Pt states that she is feeling well.  )  She denies any symptoms of chest pain, shortness of breath, palpitations, orthopnea, PND.  She is fairly active without any significant limitations.  She is compliant with her medications and denies any side effects to her current regimen.     Assessment and Plan:  1. CAD involoving bypass graft, S/P CABG x2 2005  She denies chest pain. Continue with current medical therapy.    2. Palpitations  Her symptoms are improved.  Her Holter in January revealed PVCs, we went over her results in detail.  She is taking metoprolol as needed.     3. Stenosis of right carotid artery, S/P stent to right ICA 05/2013  Her carotid doppler in February showed no changes.  We went over the results in detail.    4. Hypercholesteremia  09/02/2014 - TC: 152, TRG: 176, HDL: 48, LDL: 69  08/08/2015: TC: 162. TRG: 127, HDL: 49, LDL: 88  She was not able to tolerate Crestor due to myalgia. She was also unable to tolerate other statins.   We will try Zetia 10 mg once daily.      -Lipid panel    Return in about 6 months (around 08/22/2016).      Previous Cardiac/ Vascular Testing:  Carotid Doppler 07/07/2015: 1. Patent stent in R internal Carotid artery 2. No significant carotid stenosis 3. Ante-grade bilateral vertebral flow 4. Compared to the prior study, there has been no significant change.    Holter 06/08/2015: 1. Normal sinus rhythm. 2. Premature atrial contractions, rare. 3. Premature ventricular contractions, infrequent.    Stent Carotid w/ protection 06/11/2013: 1. Severe right internal carotid artery ostial stenosis in the 80% to 90%  Range.  2. Mild left  internal carotid artery stenosis.  3. Successful carotid stent placement with distal protection device.    History:     Past Medical History:   Diagnosis Date   . Carotid stenosis    . Coronary artery disease    . Disorder of thyroid     hypothyroid   . Stented coronary artery        No Known Allergies    Past Surgical History:   Procedure Laterality Date   . CARDIAC SURGERY  07/2002    cabg x 2   . CORONARY ANGIOPLASTY     . CORONARY ARTERY BYPASS GRAFT     . TONSILLECTOMY         Social History     Social History   . Marital status: Married     Spouse name: N/A   . Number of children: N/A   . Years of education: N/A     Occupational History   . Not on file.     Social History Main Topics   . Smoking status: Never Smoker   . Smokeless tobacco: Never Used   . Alcohol use 0.0 oz/week   . Drug use: No   . Sexual activity: Not on file     Other Topics Concern   . Not on file     Social History Narrative   .  No narrative on file       Family History   Problem Relation Age of Onset   . Adopted: Yes         Review of Systems:  General: negative for - fever, malaise or night sweats  Respiratory: negative for - cough, orthopnea, shortness of breath or wheezing  Cardiovascular: SEE HPI  Gastrointestinal: negative for - nausea, vomiting, or GI bleeding  Musculoskeletal: negative for - pain in leg or swelling in leg   Neurological: negative for - syncope, visual changes, weakness or headache    Current Outpatient Prescriptions   Medication Sig Dispense Refill   . aspirin 81 MG chewable tablet Chew by mouth.     . levothyroxine (SYNTHROID, LEVOTHROID) 150 MCG tablet Take 137 mcg by mouth Once a day at 6:00am.     . zolpidem (AMBIEN CR) 12.5 MG CR tablet Take 12.5 mg by mouth nightly as needed.       No current facility-administered medications for this visit.          Physical Examination:  BP 150/84   Pulse 64   Ht 1.753 m (5\' 9" )   Wt 85.5 kg (188 lb 6.4 oz)   BMI 27.82 kg/m       General Appearance:    Alert,  cooperative, no distress, appears stated age   HEENT:   Normocephalic, Normal mucous membranes, No   Xanthomas, No      sclera discoloration   Neck:   Supple, symmetrical, no adenopathy;        thyroid:   no carotid bruit or JVD   Lungs:     Clear to auscultation bilaterally, respirations unlabored   Heart:    RRR, S1 and S2 normal, no murmur, no rub or gallop   Abdomen:     Soft, non-tender, bowel sounds active all four quadrants,     no masses, no organomegaly   Extremities:   Extremities normal, atraumatic, no edema, no cyanosis,   Pulses:   2+ and symmetric all extremities   Skin:   Skin color, texture, turgor normal, no rashes or lesions   Neurologic:   CNII-XII grossly intact, no other focal sensory or motor deficits       Frederic Jericho, MD, [facility], [facility]  [place] [facility]  Assistant Clinical Professor of Medicine at   [facility]

## 2016-03-16 ENCOUNTER — Other Ambulatory Visit (INDEPENDENT_AMBULATORY_CARE_PROVIDER_SITE_OTHER): Payer: Self-pay | Admitting: Internal Medicine

## 2016-03-16 ENCOUNTER — Encounter (INDEPENDENT_AMBULATORY_CARE_PROVIDER_SITE_OTHER): Payer: Self-pay | Admitting: Internal Medicine

## 2016-03-16 ENCOUNTER — Ambulatory Visit (FREE_STANDING_LABORATORY_FACILITY): Payer: Commercial Managed Care - POS | Admitting: Internal Medicine

## 2016-03-16 VITALS — BP 117/76 | HR 56 | Temp 98.2°F | Resp 16 | Ht 69.0 in | Wt 184.4 lb

## 2016-03-16 DIAGNOSIS — Z1239 Encounter for other screening for malignant neoplasm of breast: Secondary | ICD-10-CM

## 2016-03-16 DIAGNOSIS — Z23 Encounter for immunization: Secondary | ICD-10-CM

## 2016-03-16 DIAGNOSIS — E039 Hypothyroidism, unspecified: Secondary | ICD-10-CM

## 2016-03-16 DIAGNOSIS — R8290 Unspecified abnormal findings in urine: Secondary | ICD-10-CM

## 2016-03-16 DIAGNOSIS — Z0000 Encounter for general adult medical examination without abnormal findings: Secondary | ICD-10-CM

## 2016-03-16 DIAGNOSIS — G4709 Other insomnia: Secondary | ICD-10-CM | POA: Insufficient documentation

## 2016-03-16 LAB — LIPID PANEL
Cholesterol / HDL Ratio: 4.1
Cholesterol: 178 mg/dL (ref 0–199)
HDL: 43 mg/dL (ref 40–9999)
LDL Calculated: 104 mg/dL — ABNORMAL HIGH (ref 0–99)
Triglycerides: 157 mg/dL — ABNORMAL HIGH (ref 34–149)
VLDL Calculated: 31 mg/dL (ref 10–40)

## 2016-03-16 LAB — URINE MICROSCOPIC

## 2016-03-16 LAB — CBC AND DIFFERENTIAL
Absolute NRBC: 0 10*3/uL
Basophils Absolute Automated: 0.02 10*3/uL (ref 0.00–0.20)
Basophils Automated: 0.4 %
Eosinophils Absolute Automated: 0.16 10*3/uL (ref 0.00–0.70)
Eosinophils Automated: 2.8 %
Hematocrit: 43.7 % (ref 37.0–47.0)
Hgb: 14.7 g/dL (ref 12.0–16.0)
Immature Granulocytes Absolute: 0.02 10*3/uL
Immature Granulocytes: 0.4 %
Lymphocytes Absolute Automated: 1.54 10*3/uL (ref 0.50–4.40)
Lymphocytes Automated: 27.2 %
MCH: 29.5 pg (ref 28.0–32.0)
MCHC: 33.6 g/dL (ref 32.0–36.0)
MCV: 87.6 fL (ref 80.0–100.0)
MPV: 11.5 fL (ref 9.4–12.3)
Monocytes Absolute Automated: 0.32 10*3/uL (ref 0.00–1.20)
Monocytes: 5.6 %
Neutrophils Absolute: 3.61 10*3/uL (ref 1.80–8.10)
Neutrophils: 63.6 %
Nucleated RBC: 0 /100 WBC (ref 0.0–1.0)
Platelets: 216 10*3/uL (ref 140–400)
RBC: 4.99 10*6/uL (ref 4.20–5.40)
RDW: 13 % (ref 12–15)
WBC: 5.67 10*3/uL (ref 3.50–10.80)

## 2016-03-16 LAB — COMPREHENSIVE METABOLIC PANEL
ALT: 28 U/L (ref 0–55)
AST (SGOT): 23 U/L (ref 5–34)
Albumin/Globulin Ratio: 1.4 (ref 0.9–2.2)
Albumin: 4.3 g/dL (ref 3.5–5.0)
Alkaline Phosphatase: 88 U/L (ref 37–106)
BUN: 15 mg/dL (ref 7.0–19.0)
Bilirubin, Total: 0.8 mg/dL (ref 0.1–1.2)
CO2: 25 mEq/L (ref 21–30)
Calcium: 9.9 mg/dL (ref 8.5–10.5)
Chloride: 105 mEq/L (ref 100–111)
Creatinine: 0.7 mg/dL (ref 0.4–1.5)
Globulin: 3.1 g/dL (ref 2.0–3.7)
Glucose: 86 mg/dL (ref 70–100)
Potassium: 4 mEq/L (ref 3.5–5.1)
Protein, Total: 7.4 g/dL (ref 6.0–8.3)
Sodium: 140 mEq/L (ref 135–146)

## 2016-03-16 LAB — URINALYSIS
Bilirubin, UA: NEGATIVE
Glucose, UA: NEGATIVE
Ketones UA: NEGATIVE
Nitrite, UA: POSITIVE — AB
Protein, UR: NEGATIVE
Specific Gravity UA: 1.027 (ref 1.001–1.035)
Urine pH: 6 (ref 5.0–8.0)
Urobilinogen, UA: 0.2

## 2016-03-16 LAB — GFR: EGFR: >60

## 2016-03-16 LAB — HEMOGLOBIN A1C
Average Estimated Glucose: 99.7 mg/dL
Hemoglobin A1C: 5.1 % (ref 4.6–5.9)

## 2016-03-16 LAB — HEMOLYSIS INDEX: Hemolysis Index: 3 (ref 0–18)

## 2016-03-16 LAB — T3, FREE: T3, Free: 2.45 pg/mL (ref 1.71–3.71)

## 2016-03-16 LAB — T4, FREE: T4 Free: 1.22 ng/dL (ref 0.70–1.48)

## 2016-03-16 LAB — TSH: TSH: 0.59 u[IU]/mL (ref 0.35–4.94)

## 2016-03-16 MED ORDER — LEVOTHYROXINE SODIUM 150 MCG PO TABS
150.0000 ug | ORAL_TABLET | Freq: Every day | ORAL | 3 refills | Status: DC
Start: 2016-03-16 — End: 2016-04-03

## 2016-03-16 MED ORDER — ZOLPIDEM TARTRATE ER 12.5 MG PO TBCR
12.5000 mg | EXTENDED_RELEASE_TABLET | Freq: Every evening | ORAL | 2 refills | Status: DC | PRN
Start: 2016-03-16 — End: 2016-04-03

## 2016-03-16 NOTE — Addendum Note (Signed)
Addended by: Teena Irani on: 03/16/2016 10:38 AM     Modules accepted: Orders

## 2016-03-16 NOTE — Patient Instructions (Signed)
Prevention Guidelines, Women Ages 50 to 64  Screening tests and vaccines are an important part of managing your health. Health counseling is essential, too. Below are guidelines for these, for women ages 50 to 64. Talk with your healthcare provider to make sure you're up to date on what you need.  Screening Who needs it How often   Type 2 diabetes or prediabetes All adults beginning at age 63 and adults without symptoms at any age who are overweight or obese and have 1 or more additional risk factors for diabetes. At least every 3 years   Alcohol misuse All women in this age group At routine exams   Blood pressure All women in this age group Every 2 years if your blood pressure is less than 120/80 mm Hg; yearly if your systolic blood pressure is 120 to 139 mm Hg, or your diastolic blood pressure reading is 80 to 89 mm Hg   Breast cancer All women in this age group Yearly mammogram and clinical breast exam1   Cervical cancer All women in this age group, except women who have had a complete hysterectomy Pap test every 3 years or Pap test with human papillomavirus (HPV) test every 5 years   Chlamydia Women at increased risk for infection At routine exams   Colorectal cancer All women in this age group Flexible sigmoidoscopy every 5 years, or colonoscopy every 10 years, or double-contrast barium enema every 5 years; yearly fecal occult blood test or fecal immunochemical test; or a stool DNA test as often as your health care provider advises; talk with your health care provider about which tests are best for you   Depression All women in this age group At routine exams   Gonorrhea Sexually active women at increased risk for infection At routine exams   Hepatitis C Anyone at increased risk; 1 time for those born between 1945 and 1965 At routine exams   High cholesterol or triglycerides All women in this age group who are at risk for coronary artery disease At least every 5 years   HIV All women At routine exams   Lung  cancer Adults age 55 to 80 who have smoked Yearly screening in smokers with 30 pack-year history of smoking or who quit within 15 years   Obesity All women in this age group At routine exams   Osteoporosis Women who are postmenopausal Ask your healthcare provider   Syphilis Women at increased risk for infection - talk with your healthcare provider At routine exams   Tuberculosis Women at increased risk for infection - talk with your healthcare provider Ask your healthcare provider   Vision All women in this age group Ask your healthcare provider   Vaccine Who needs it How often   Chickenpox (varicella) All women in this age group who have no record of this infection or vaccine 2 doses; the second dose should be given at least 4 weeks after the first dose   Hepatitis A Women at increased risk for infection - talk with your healthcare provider 2 doses given at least 6 months apart   Hepatitis B Women at increased risk for infection - talk with your healthcare provider 3 doses over 6 months; second dose should be given 1 month after the first dose; the third dose should be given at least 2 months after the second dose and at least 4 months after the first dose   Haemophilus influenzaeType B (HIB) Women at increased risk for infection - talk   with your healthcare provider 1 to 3 doses   Influenza (flu) All women in this age group Once a year   Measles, mumps, rubella (MMR) Women in this age group through their late 50s who have no record of these infections or vaccines 1 dose   Meningococcal Women at increased risk for infection - talk with your healthcare provider 1 or more doses   Pneumococcal conjugate vaccine (PCV13) and pneumococcal polysaccharide vaccine (PPSV23) Women at increased risk for infection - talk with your healthcare provider PCV13: 1 dose ages 19 to 65 (protects against 13 types of pneumococcal bacteria)  PPSV23: 1 to 2 doses through age 64, or 1 dose at 65 or older (protects against 23 types of  pneumococcal bacteria)   Tetanus/diphtheria/pertussis (Td/Tdap) booster All women in this age group Td every 10 years, or a one-time dose of Tdap instead of a Td booster after age 18, then Td every 10 years   Zoster All women ages 60 and older 1 dose   Counseling Who needs it How often   BRCA gene mutation testing for breast and ovarian cancer susceptibility Women with increased risk for having gene mutation When your risk is known   Breast cancer and chemoprevention Women at high risk for breast cancer When your risk is known   Diet and exercise Women who are overweight or obese When diagnosed, and then at routine exams   Sexually transmitted infection prevention Women at increased risk for infection - talk with your healthcare provider At routine exams   Use of daily aspirin Women ages 55 and up in this age group who are at risk for cardiovascular health problems such as stroke When your risk is known   Use of tobacco and the health effects it can cause All women in this age group Every exam   1American [employer]  Date Last Reviewed: 06/22/2014   2000-2016 [employer] [address], [place] [address]. All rights reserved. This information is not intended as a substitute for professional medical care. Always follow your healthcare professional's instructions.

## 2016-03-16 NOTE — Progress Notes (Signed)
Subjective:      Date: 03/16/2016 10:20 AM   Patient ID: Teresa Briggs is a 65 y.o. female.    Chief Complaint:  Chief Complaint   Patient presents with   . Annual Exam     HPI    HPI  Visit Type: Health Maintenance Visit home with spouse, has 2 new grandchildren ([place] and [place])  Work Status: retired 2 years ago, low stress currently  Reported Health: good health (despite bypass at 52 and stenting at 4)  Diet: well-balanced diet  Exercise: working to improve exercise  Dental: regular dental visits twice a year  Vision: contact lenses  Hearing: normal hearing  Immunization Status: declines flu shot and shingles will get today  Prior Screening Tests: pap smear > 1 year ago and mammogram > 1 year ago  General Health Risks: no family history of colon cancer and no family history of breast cancer  Safety Elements Used: uses seat belts      Problem List:  Patient Active Problem List   Diagnosis   . 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 Anemia   . Angina pectoris   . Cerebrovascular disease   . Hypothyroidism   . Left bundle branch block (LBBB)   . Palpitations   . Preoperative cardiovascular examination   . CAD involoving bypass graft, S/P CABG x2 2005   . Hypercholesterolemia   . Hypercholesteremia   . Other insomnia       Current Medications:  Current Outpatient Prescriptions   Medication Sig Dispense Refill   . aspirin 81 MG chewable tablet Chew by mouth.     . ezetimibe (ZETIA) 10 MG tablet Take 1 tablet (10 mg total) by mouth daily. 30 tablet 5   . levothyroxine (SYNTHROID, LEVOTHROID) 150 MCG tablet Take 1 tablet (150 mcg total) by mouth Once a day at 6:00am. 90 tablet 3   . zolpidem (AMBIEN CR) 12.5 MG CR tablet Take 1 tablet (12.5 mg total) by mouth nightly as needed for Sleep. 30 tablet 2     No current facility-administered medications for this visit.        Allergies:  Allergies   Allergen Reactions   . Statins      "Severe Muscle Weakness and Soreness"       Past  Medical History:  Past Medical History:   Diagnosis Date   . Carotid stenosis    . Coronary artery disease    . Disorder of thyroid     hypothyroid   . Stented coronary artery        Past Surgical History:  Past Surgical History:   Procedure Laterality Date   . CARDIAC SURGERY  07/2002    cabg x 2   . CORONARY ANGIOPLASTY     . CORONARY ARTERY BYPASS GRAFT     . TONSILLECTOMY         Family History:  Family History   Problem Relation Age of Onset   . Adopted: Yes       Social History:  Social History     Social History   . Marital status: Married     Spouse name: N/A   . Number of children: N/A   . Years of education: N/A     Occupational History   . Not on file.     Social History Main Topics   . Smoking status: Never Smoker   . Smokeless tobacco: Never Used   .  Alcohol use 0.0 oz/week   . Drug use: No   . Sexual activity: Not on file     Other Topics Concern   . Not on file     Social History Narrative   . No narrative on file       The following sections were reviewed this encounter by the provider:        Vitals:  BP 117/76 (BP Site: Left arm, Patient Position: Sitting, Cuff Size: Large)   Pulse (!) 56   Temp 98.2 F (36.8 C) (Oral)   Resp 16   Ht 1.753 m (5\' 9" )   Wt 83.6 kg (184 lb 6.4 oz)   SpO2 99%   BMI 27.23 kg/m       ROS:  Review of Systems   Constitutional: Negative for chills and fever.   HENT: Negative for congestion and rhinorrhea.    Eyes: Negative for discharge and redness.   Respiratory: Negative for shortness of breath and wheezing.    Cardiovascular: Negative for chest pain and palpitations.   Gastrointestinal: Negative for constipation, diarrhea, nausea and vomiting.   Musculoskeletal: Negative for arthralgias and myalgias.   Skin:        No rashes or itchy spots that are new   Neurological: Negative for dizziness, light-headedness and headaches.   Hematological: Negative for adenopathy. Does not bruise/bleed easily.   Psychiatric/Behavioral: Negative for dysphoric mood. The patient is  not nervous/anxious.          Objective:       Physical Exam:  Physical Exam   Constitutional: She is oriented to person, place, and time. She appears well-developed and well-nourished.   HENT:   Head: Normocephalic and atraumatic.   Right Ear: External ear normal.   Left Ear: External ear normal.   Mouth/Throat: Oropharynx is clear and moist.   Eyes: EOM are normal. Pupils are equal, round, and reactive to light. No scleral icterus.   Neck: Neck supple. No JVD present. No thyromegaly present.   Cardiovascular: Normal rate, regular rhythm and normal heart sounds.    No murmur heard.  Pulmonary/Chest: Effort normal and breath sounds normal. No respiratory distress.   Abdominal: Soft. She exhibits no distension. There is no tenderness.   Musculoskeletal: She exhibits no edema.   Lymphadenopathy:     She has no cervical adenopathy.   Neurological: She is alert and oriented to person, place, and time.   Skin: Skin is warm and dry. Capillary refill takes less than 2 seconds.   Psychiatric: She has a normal mood and affect. Her behavior is normal.   Nursing note and vitals reviewed.    Assessment/Plan:       1. Annual physical exam  - CBC and differential  - Comprehensive metabolic panel  - Lipid panel  - Urinalysis  - Hemoglobin A1C    65 y/o woman here for routine exam doing well, has hx of CAD and hypothyroid that are controlled.  Discussed diet and exercise goals, recommend 30 min 5 days a week, counseled on safety (seat belts, texting and driving, drinking and driving, riding with people who have been drinking), moderation (calories, caffeine, alcohol etc), discussed domestic violence awareness, counseled on sleep hygiene (getting 6 hrs sleep minimum), recommend annual flu shots, discussed appropriate age for 81mg  ASA, counseled on colon cancer screening.    2. Hypothyroidism, unspecified type  - TSH  - T4, free  - T3, free  - levothyroxine (SYNTHROID, LEVOTHROID) 150 MCG tablet; Take 1  tablet (150 mcg total) by  mouth Once a day at 6:00am.  Dispense: 90 tablet; Refill: 3    3. Other insomnia  - zolpidem (AMBIEN CR) 12.5 MG CR tablet; Take 1 tablet (12.5 mg total) by mouth nightly as needed for Sleep.  Dispense: 30 tablet; Refill: 2          Gale Journey, MD

## 2016-03-17 ENCOUNTER — Encounter (INDEPENDENT_AMBULATORY_CARE_PROVIDER_SITE_OTHER): Payer: Self-pay | Admitting: Internal Medicine

## 2016-03-17 NOTE — Progress Notes (Signed)
Called patient to specifically discuss her urinalysis which looks like she has a UTI, unfortunately unable to reach her but looks suspicious for a urinary tract infection and would recommend treatment with a few days of antibiotics,     blood sugar average (A1C) is normal,  thyroid testing was normal (TSH, T3, T4), Blood counts are normal, liver enzymes (AST/ALT) were normal, kidney function (GFR) is in the acceptable range, electrolytes are normal, cholesterol was very minimally elevated and would recommend continuing the zetia, a heart healthy diet and regular exercise

## 2016-03-18 ENCOUNTER — Encounter (INDEPENDENT_AMBULATORY_CARE_PROVIDER_SITE_OTHER): Payer: Self-pay | Admitting: Internal Medicine

## 2016-03-18 DIAGNOSIS — N3000 Acute cystitis without hematuria: Secondary | ICD-10-CM

## 2016-03-19 MED ORDER — NITROFURANTOIN MONOHYD MACRO 100 MG PO CAPS
100.0000 mg | ORAL_CAPSULE | Freq: Two times a day (BID) | ORAL | 0 refills | Status: AC
Start: 2016-03-19 — End: 2016-03-24

## 2016-03-19 NOTE — Telephone Encounter (Signed)
Called and spoke with patient.

## 2016-03-19 NOTE — Telephone Encounter (Signed)
Called her and discussed it

## 2016-04-03 ENCOUNTER — Other Ambulatory Visit (INDEPENDENT_AMBULATORY_CARE_PROVIDER_SITE_OTHER): Payer: Self-pay

## 2016-04-03 DIAGNOSIS — G4709 Other insomnia: Secondary | ICD-10-CM

## 2016-04-03 DIAGNOSIS — E039 Hypothyroidism, unspecified: Secondary | ICD-10-CM

## 2016-04-03 NOTE — Telephone Encounter (Signed)
Pt called stating she is out of town in [facility] and forgot her Levothyroxine and Ambien prescriptions. Pt requesting a small amount to be sent to pharm in [facility].

## 2016-04-04 MED ORDER — LEVOTHYROXINE SODIUM 150 MCG PO TABS
150.0000 ug | ORAL_TABLET | Freq: Every day | ORAL | 0 refills | Status: DC
Start: 2016-04-04 — End: 2016-11-14

## 2016-04-04 MED ORDER — ZOLPIDEM TARTRATE ER 12.5 MG PO TBCR
12.5000 mg | EXTENDED_RELEASE_TABLET | Freq: Every evening | ORAL | 0 refills | Status: DC | PRN
Start: 2016-04-04 — End: 2016-10-05

## 2016-04-04 NOTE — Telephone Encounter (Signed)
Sent it

## 2016-05-11 ENCOUNTER — Telehealth (INDEPENDENT_AMBULATORY_CARE_PROVIDER_SITE_OTHER): Payer: Self-pay

## 2016-05-11 NOTE — Telephone Encounter (Signed)
Patient is participating in the [facility] CV Outcomes Study and randomized to Belviq 10 mg bid (Lorcaserin Hydrochloride-Serotonin 2C receptor agonist) versus Placebo.    Patient is off drug but continues with phone follow-up.     The research study is closing March 2018. All patients that are off study drug and continue with phone visits are to complete the final EOS visit, preferably in-person, January 2018.     I called the patient today to see if/when they would be willing to complete their EOS visit, in-person, during January 2018. I explained that if they're unwilling to do the visit in-person, that I will need to call them in January 2018 to discuss their medications and medical history.     The EOS visit requires fasting bloodwork. No EKG is needed. No physical exam by the physician is needed.     I left a voicemail on the cell phone and home phone. I emailed the patient as well ([email or web address]).     Mollie Germany, BSN, Education officer, community Cardiology Ambulatory Research

## 2016-05-30 ENCOUNTER — Telehealth (INDEPENDENT_AMBULATORY_CARE_PROVIDER_SITE_OTHER): Payer: Self-pay

## 2016-05-30 NOTE — Telephone Encounter (Signed)
05/30/2016  3:14 PM    All activities conducted per protocol schedule of events.     No billable charges.    Teresa Briggs is a 66 y.o. female participating in the [facility] CV Outcomes Study and randomized to Belviq 10 mg bid (Lorcaserin Hydrochloride-Serotonin 2C receptor agonist) versus Placebo.    I called pt's home phone and cell phone, leaving voicemail's on both.    The [facility] Research Study is ending this month, January 2018. All of the patients that are off study drug and continue with phone visits are to complete one final phone visit by January 31st. I asked patient to return by call so that we can complete her final phone visit.    Mollie Germany, BSN, Education officer, community Cardiology Ambulatory Research wo

## 2016-06-01 ENCOUNTER — Telehealth (INDEPENDENT_AMBULATORY_CARE_PROVIDER_SITE_OTHER): Payer: Self-pay

## 2016-06-01 NOTE — Telephone Encounter (Signed)
06/01/2016  12:26 PM    All activities conducted per protocol schedule of events.     No billable charges.    Teresa Briggs is a 66 y.o. female participating in the [facility] CV Outcomes Study and randomized to Belviq 10 mg bid (Lorcaserin Hydrochloride-Serotonin 2C receptor agonist) versus Placebo.    Subject completed EOT visit one year ago, 06/02/15, and has been off study drug since.    EOS Visit conducted today via phone visit. Subject was unwilling to complete EOS visit in person.    Subject reports no new s/s, dx, tx, sx, or hospitalizations since last phone contact 01/2016. Subject reports minor medication changes.      Mollie Germany, BSN, Education officer, community Cardiology Ambulatory Research

## 2016-06-01 NOTE — Telephone Encounter (Signed)
06/01/2016  11:26 AM    All activities conducted per protocol schedule of events.     No billable charges.    Teresa Briggs is a 66 y.o. female participating in the [facility] CV Outcomes Study and randomized to Belviq 10 mg bid (Lorcaserin Hydrochloride-Serotonin 2C receptor agonist) versus Placebo.    The [facility] Research Study is ending this month, January 2018. All of the patients that are off study drug and continue with phone visits are to complete one final phone visit by January 31st.    I emailed the patient on Wednesday 05/30/16 asking what day / time works best to call her. Patient responded to email Thursday 01/04 saying that she prefers to be called before noon and to call her cell # [phone]. I responded to her email the same day saying that I would call her today around 11:30am.    I called pt's cell phone but she did not answer. I left a voicemail with my call back number. I said that I would call her Monday morning 01/08 around 9:30 AM if she didn't call back today.    Mollie Germany, BSN, Education officer, community Cardiology Ambulatory Research

## 2016-08-15 ENCOUNTER — Ambulatory Visit: Payer: No Typology Code available for payment source

## 2016-08-22 ENCOUNTER — Ambulatory Visit: Payer: No Typology Code available for payment source

## 2016-08-23 ENCOUNTER — Other Ambulatory Visit: Payer: Self-pay

## 2016-08-23 ENCOUNTER — Ambulatory Visit
Admission: RE | Admit: 2016-08-23 | Discharge: 2016-08-23 | Disposition: A | Discharge status: Home / Self Care | Payer: Self-pay | Source: Ambulatory Visit

## 2016-08-23 ENCOUNTER — Ambulatory Visit: Payer: No Typology Code available for payment source | Attending: Internal Medicine

## 2016-08-23 DIAGNOSIS — Z1231 Encounter for screening mammogram for malignant neoplasm of breast: Secondary | ICD-10-CM | POA: Insufficient documentation

## 2016-08-23 DIAGNOSIS — Z1239 Encounter for other screening for malignant neoplasm of breast: Secondary | ICD-10-CM

## 2016-08-23 DIAGNOSIS — N6489 Other specified disorders of breast: Secondary | ICD-10-CM | POA: Insufficient documentation

## 2016-08-27 ENCOUNTER — Other Ambulatory Visit (INDEPENDENT_AMBULATORY_CARE_PROVIDER_SITE_OTHER): Payer: Self-pay | Admitting: Internal Medicine

## 2016-08-27 ENCOUNTER — Telehealth (INDEPENDENT_AMBULATORY_CARE_PROVIDER_SITE_OTHER): Payer: Self-pay

## 2016-08-27 DIAGNOSIS — Z1239 Encounter for other screening for malignant neoplasm of breast: Secondary | ICD-10-CM

## 2016-08-27 DIAGNOSIS — R928 Other abnormal and inconclusive findings on diagnostic imaging of breast: Secondary | ICD-10-CM

## 2016-08-27 NOTE — Telephone Encounter (Signed)
Sharl Ma from next door called stating patient needs to come back for a mammogram, they need a new order and it needs to say "Addition views right breast for abnormal mammogram, [place] PRN.

## 2016-08-28 ENCOUNTER — Other Ambulatory Visit (INDEPENDENT_AMBULATORY_CARE_PROVIDER_SITE_OTHER): Payer: Self-pay | Admitting: Internal Medicine

## 2016-08-28 DIAGNOSIS — R928 Other abnormal and inconclusive findings on diagnostic imaging of breast: Secondary | ICD-10-CM

## 2016-08-28 NOTE — Addendum Note (Signed)
Addended by: Williemae Area T on: 08/28/2016 01:48 PM     Modules accepted: Orders

## 2016-08-28 NOTE — Telephone Encounter (Signed)
Order for additional views is in

## 2016-08-30 ENCOUNTER — Ambulatory Visit: Payer: No Typology Code available for payment source | Attending: Internal Medicine

## 2016-08-30 ENCOUNTER — Ambulatory Visit: Payer: No Typology Code available for payment source

## 2016-08-30 DIAGNOSIS — R928 Other abnormal and inconclusive findings on diagnostic imaging of breast: Secondary | ICD-10-CM

## 2016-08-30 DIAGNOSIS — N6489 Other specified disorders of breast: Secondary | ICD-10-CM | POA: Insufficient documentation

## 2016-08-30 DIAGNOSIS — Z1239 Encounter for other screening for malignant neoplasm of breast: Secondary | ICD-10-CM

## 2016-08-31 NOTE — Progress Notes (Signed)
6 month repeat, results discussed at time of exam

## 2016-10-05 ENCOUNTER — Other Ambulatory Visit (INDEPENDENT_AMBULATORY_CARE_PROVIDER_SITE_OTHER): Payer: Self-pay | Admitting: Internal Medicine

## 2016-10-05 DIAGNOSIS — G4709 Other insomnia: Secondary | ICD-10-CM

## 2016-10-05 MED ORDER — ZOLPIDEM TARTRATE ER 12.5 MG PO TBCR
12.5000 mg | EXTENDED_RELEASE_TABLET | Freq: Every evening | ORAL | 2 refills | Status: DC | PRN
Start: 2016-10-05 — End: 2017-06-12

## 2016-10-05 NOTE — Telephone Encounter (Signed)
Name, strength, directions of requested refill(s):  zolpidem (AMBIEN CR) 12.5 MG CR tablet     Pharmacy to send refill to or patient to pick up rx from office (mark requested pharmacy in [place]):      [facility] [place], [place] - [address] [facility]  [address]  [place] [address]  Phone: [phone] Fax: [phone]    [facility] [address] [place], [place] - [address]  [address]  [place] [address]  Phone: [phone] Fax: [phone]    [id number] - [place], [place] - [address] AT [facility] [address]  [address] [place]  [place] [address]  Phone: [phone] Fax: [phone]        Please mark "X" next to the preferred call back number:    Mobile:   Telephone Information:   Mobile [phone]    x   Home: [phone]    Work:           Next visit: Visit date not found

## 2016-10-05 NOTE — Telephone Encounter (Signed)
Faxed it to the pharmacy

## 2016-11-14 ENCOUNTER — Other Ambulatory Visit (INDEPENDENT_AMBULATORY_CARE_PROVIDER_SITE_OTHER): Payer: Self-pay | Admitting: Internal Medicine

## 2016-11-14 DIAGNOSIS — E039 Hypothyroidism, unspecified: Secondary | ICD-10-CM

## 2016-11-15 NOTE — Telephone Encounter (Signed)
Sent it

## 2016-12-03 ENCOUNTER — Ambulatory Visit (INDEPENDENT_AMBULATORY_CARE_PROVIDER_SITE_OTHER): Payer: No Typology Code available for payment source | Admitting: Internal Medicine

## 2016-12-03 ENCOUNTER — Encounter (INDEPENDENT_AMBULATORY_CARE_PROVIDER_SITE_OTHER): Payer: Self-pay | Admitting: Internal Medicine

## 2016-12-03 VITALS — BP 149/82 | HR 67 | Temp 97.6°F | Resp 14 | Ht 69.0 in | Wt 200.6 lb

## 2016-12-03 DIAGNOSIS — J3489 Other specified disorders of nose and nasal sinuses: Secondary | ICD-10-CM

## 2016-12-03 DIAGNOSIS — R1031 Right lower quadrant pain: Secondary | ICD-10-CM

## 2016-12-03 MED ORDER — MUPIROCIN 2 % EX OINT
TOPICAL_OINTMENT | CUTANEOUS | 0 refills | Status: AC
Start: 2016-12-03 — End: 2016-12-08

## 2016-12-03 NOTE — Progress Notes (Signed)
Subjective:      Date: 12/03/2016 11:27 AM   Patient ID: Teresa Briggs is a 66 y.o. female.    Chief Complaint:  Chief Complaint   Patient presents with   . Recurrent Skin Infections     "sore in left nare that does not heal for many months"   . Abdominal Pain     "right lower abdominal pain, near right groin, intermittent pain causes the limping due to so much pain"     HPI    Says for the last few months has had a sore in the nose left anterior nose, says not healing well.  Can be runny in both sides but the left side is sore.  No external lesions.  No bleeding she has noted.  No trauma she is aware.  Says when she feels like the skin is irritated she pulls the hairs out she uses a tweezers.  Concerned was an ingrown hair    When she had the heart cath had some pain, when Dr Richrd Sox pushed on it was sore and sent for some tests and was normal calling it 7/10 and is years in duration and comes and goes.        Problem List:  Patient Active Problem List   Diagnosis   . 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 Anemia   . Angina pectoris   . Cerebrovascular disease   . Hypothyroidism   . Left bundle branch block (LBBB)   . Palpitations   . Preoperative cardiovascular examination   . CAD involoving bypass graft, S/P CABG x2 2005   . Hypercholesterolemia   . Hypercholesteremia   . Other insomnia       Current Medications:  Current Outpatient Prescriptions   Medication Sig Dispense Refill   . aspirin 81 MG chewable tablet Chew by mouth.     . ezetimibe (ZETIA) 10 MG tablet Take 1 tablet (10 mg total) by mouth daily. (Patient taking differently: Take 10 mg by mouth every other day.    ) 30 tablet 5   . levothyroxine (SYNTHROID, LEVOTHROID) 150 MCG tablet TAKE 1 TABLET BY MOUTH ONCE A DAY AT 6:00 AM 90 tablet 1   . nitroglycerin (NITROSTAT) 0.4 MG SL tablet Place under the tongue.     Marland Kitchen zolpidem (AMBIEN CR) 12.5 MG CR tablet Take 1 tablet (12.5 mg total) by mouth nightly as needed for Sleep.  10 tablet 2   . mupirocin (BACTROBAN) 2 % ointment Apply 0.5g intranasally to each nostril BID x 5 days 22 g 0     No current facility-administered medications for this visit.        Allergies:  Allergies   Allergen Reactions   . Statins      "Severe Muscle Weakness and Soreness"       Past Medical History:  Past Medical History:   Diagnosis Date   . Carotid stenosis    . Coronary artery disease    . Disorder of thyroid     hypothyroid   . Stented coronary artery        Past Surgical History:  Past Surgical History:   Procedure Laterality Date   . CARDIAC SURGERY  07/2002    cabg x 2   . CORONARY ANGIOPLASTY     . CORONARY ARTERY BYPASS GRAFT     . TONSILLECTOMY         Family History:  Family History   Problem  Relation Age of Onset   . Adopted: Yes       Social History:  Social History     Social History   . Marital status: Married     Spouse name: N/A   . Number of children: N/A   . Years of education: N/A     Occupational History   . Not on file.     Social History Main Topics   . Smoking status: Never Smoker   . Smokeless tobacco: Never Used   . Alcohol use 0.0 oz/week   . Drug use: No   . Sexual activity: Not on file     Other Topics Concern   . Not on file     Social History Narrative   . No narrative on file       The following sections were reviewed this encounter by the provider:   Allergies  Meds  Problems         Vitals:  BP 149/82 (BP Site: Left arm, Patient Position: Sitting, Cuff Size: Large)   Pulse 67   Temp 97.6 F (36.4 C) (Oral)   Resp 14   Ht 1.753 m (5\' 9" )   Wt 91 kg (200 lb 9.6 oz)   SpO2 97%   BMI 29.62 kg/m       ROS:  Review of Systems   Constitutional: Negative for chills and fever.   HENT: Negative for congestion, postnasal drip, rhinorrhea, sinus pain and sinus pressure.    Respiratory: Positive for cough (intermittent). Negative for shortness of breath.    Cardiovascular: Positive for palpitations (known left bundle as well). Negative for chest pain.   Skin: Negative for  color change, rash and wound.   Neurological: Negative for headaches.          Objective:       Physical Exam:  Physical Exam   Constitutional: She appears well-developed and well-nourished. No distress.   HENT:   Head: Normocephalic and atraumatic.   Right Ear: External ear normal.   Left Ear: External ear normal.   Mouth/Throat: Oropharynx is clear and moist.   Noted on the medial side of the left nostril to have irritation, thinned skin/bleeding from a sore, nothing to the lateral edge of the nose   Eyes: EOM are normal. Pupils are equal, round, and reactive to light. Right eye exhibits no discharge. Left eye exhibits no discharge.   Neck: Neck supple. No JVD present. No thyromegaly present.   Cardiovascular: Normal rate, regular rhythm and intact distal pulses.    Pulmonary/Chest: Effort normal and breath sounds normal. No respiratory distress. She has no wheezes.   Lymphadenopathy:     She has no cervical adenopathy.   Skin: Skin is warm and dry.   Nursing note and vitals reviewed.         Assessment/Plan:       1. Sore in nostril  - mupirocin (BACTROBAN) 2 % ointment; Apply 0.5g intranasally to each nostril BID x 5 days  Dispense: 22 g; Refill: 0    66 y/o woman with pain in the left nares, discussed ulcers, ingrown hair, infection among others, will try the bactroban ointment and monitor, advised if not resolving to see ENT for further evaluation    2. Right inguinal pain  - US Pelvis Limited; Future    Discussed previous u/s normal, will have her repeat scan if painful, monitor for space occupying lesion, vascular abnormality and discussed potential for scar tissue with her multiple  caths.      Gale Journey, MD

## 2016-12-03 NOTE — Patient Instructions (Signed)
Mupirocin nasal ointment  Brand Name: Bactroban  What is this medicine?  MUPIROCIN CALCIUM (myoo PEER oh sin KAL see um) is an antibiotic. It is used inside the nose to treat infections that are caused by certain bacteria. This helps prevent the spread of infection to patients and health care workers during outbreaks at institutions.  How should I use this medicine?  This medicine is only for use inside the nose. Follow the directions on the prescription label. Wash your hands before and after use. Squeeze half the contents of a single-use tube into one nostril, then squeeze the other half into the other nostril. Press the sides of your nose together and gently massage after application to spread the ointment throughout the nostrils. Do not use your medicine more often than directed. Finish the full course of medicine prescribed by your doctor or health care professional even if you think your condition is better.  Talk to your pediatrician regarding the use of this medicine in children. Special care may be needed.  What side effects may I notice from receiving this medicine?  Side effects that you should report to your doctor or health care professional as soon as possible:   severe irritation, burning, stinging, or pain  Side effects that usually do not require medical attention (report to your doctor or health care professional if they continue or are bothersome):   altered taste   cough   headache   skin itching   sore throat   stuffy or runny nose  What may interact with this medicine?  Interactions are not expected. Do not use any other nose products without telling your doctor or health care professional.  What if I miss a dose?  If you miss a dose, take it as soon as you can. If it is almost time for your next dose, take only that dose. Do not take double or extra doses.  Where should I keep my medicine?  Keep out of the reach of children.  Store at room temperature between 15 and 30 degrees C (59  and 86 degrees F). Do not refrigerate. One tube of ointment is for single use in both nostrils. Throw away after use.  What should I tell my health care provider before I take this medicine?  They need to know if you have any of these conditions:   an unusual or allergic reaction to mupirocin, other medicines, foods, dyes, or preservatives   pregnant or trying to get pregnant   breast-feeding  What should I watch for while using this medicine?  If your nose is severely irritated, burning or stinging from use of this medicine, stop using it and contact your doctor or health care professional.  Do not get this medicine in your eyes. If you do, rinse out with plenty of cool tap water.  NOTE:This sheet is a summary. It may not cover all possible information. If you have questions about this medicine, talk to your doctor, pharmacist, or health care provider. Copyright 2018 [employer]

## 2017-02-12 ENCOUNTER — Ambulatory Visit (INDEPENDENT_AMBULATORY_CARE_PROVIDER_SITE_OTHER): Payer: No Typology Code available for payment source | Admitting: Internal Medicine

## 2017-02-12 ENCOUNTER — Encounter (INDEPENDENT_AMBULATORY_CARE_PROVIDER_SITE_OTHER): Payer: Self-pay | Admitting: Internal Medicine

## 2017-02-12 VITALS — BP 150/82 | HR 66 | Temp 98.9°F | Resp 18 | Ht 69.0 in | Wt 204.2 lb

## 2017-02-12 DIAGNOSIS — R05 Cough: Secondary | ICD-10-CM

## 2017-02-12 DIAGNOSIS — R519 Headache, unspecified: Secondary | ICD-10-CM | POA: Insufficient documentation

## 2017-02-12 DIAGNOSIS — R51 Headache: Secondary | ICD-10-CM

## 2017-02-12 DIAGNOSIS — E039 Hypothyroidism, unspecified: Secondary | ICD-10-CM

## 2017-02-12 DIAGNOSIS — R059 Cough, unspecified: Secondary | ICD-10-CM | POA: Insufficient documentation

## 2017-02-12 DIAGNOSIS — I10 Essential (primary) hypertension: Secondary | ICD-10-CM

## 2017-02-12 MED ORDER — LEVOTHYROXINE SODIUM 150 MCG PO TABS
ORAL_TABLET | ORAL | 1 refills | Status: DC
Start: 2017-02-12 — End: 2017-08-19

## 2017-02-12 NOTE — Patient Instructions (Signed)
Hypothyroidism      You have hypothyroidism. This means your thyroid gland is not making enough thyroid hormone. This hormone is vital to body growth and metabolism. If you don't make enough, many body processes slow down. This can cause symptoms throughout the body. Hypothyroidism can range from mild to severe. The most severe form is called myxedema.  There are a number of causes of hypothyroidism. A common cause is Hashimoto's disease. This disease causes the body's own immune system to attack the thyroid gland. When you have certain treatments, such as surgery to remove the thyroid gland, this can also cause hypothyroidism.  Symptoms of hypothyroidism can include:   Fatigue   Trouble concentrating or thinking clearly; forgetfulness   Dry skin   Hair loss   Weight gain   Low tolerance to cold   Constipation   Depression   Personality changes   Tingling or prickling of the hands or feet   Heavy, absent, or irregular periods (women only)  Older adults may sometimes have other symptoms. These can include:   Muscle aches and weakness   Confusion   Incontinence (unable to control urine or stool)   Trouble moving around   Falling  Treatment for hypothyroidism involves taking thyroid hormone pills daily. These pills replace the hormone your thyroid doesn't make. You will likelyneed to take a daily pill for the rest of your life. Tips for taking this medicine are given below.  Home care  Tips for taking your medicine   Take your thyroid hormone pills as prescribed by your healthcare provider. This is most often 1 pill a day on an empty stomach. Use a pillbox labeled with the days of the week. This will help you remember to take your pill each day.   Don't take products that contain iron and calcium or antacids within 4 hours of taking your thyroid hormone pills.   Don't take other medicines with your thyroid hormone pill without checking with your provider first.   Tell your provider if you have  any side effects from your medicines that bother you.   Never change the dosageor stop taking your thyroid pills without talking to your provider first.  General care   Always talk with your provider before trying other medicines or treatments for your thyroid problem.   If you see other healthcare providers, be sure to let them know about your thyroid problem.  Follow-up care  See your healthcare provider for checkups as advised. You may need regular tests to check the level of thyroid hormone in your blood.  When to seek medical advice  Call your healthcare provider right away if any of these occur:   New symptoms develop   Symptoms return, continue, or worsen even after treatment   Extreme fatigue   Puffy hands, face, or feet   Fast or irregular heartbeat   Confusion  Call 911  Call 911 right away if any of these occur:   Fainting   Chest pain   Shortness of breath or trouble breathing  Date Last Reviewed: 01/18/2014   2000-2016 [employer] [address], [place] [address]. All rights reserved. This information is not intended as a substitute for professional medical care. Always follow your healthcare professional's instructions.

## 2017-02-12 NOTE — Progress Notes (Signed)
Subjective:      Date: 02/12/2017 12:29 PM   Patient ID: Teresa Briggs is a 66 y.o. female.    Chief Complaint:  Chief Complaint   Patient presents with   . Hypothyroidism     needs refills on levothyroxine    . lung infection     breathing in mold from house in [place].  Pt was given prednisone, finished that.  Cant clear lungs now   . Headache     headache every day, pressure in head, advil not helping.  Headache in back of head and has shooting pain.       Hypothyroidism   Presents for follow-up visit. Symptoms include anxiety (issue with worry on the mold and headache pressure), fatigue (having an issue with mold and thinks is related to that) and hoarse voice. Patient reports no constipation, diarrhea, tremors, weight gain or weight loss. The symptoms have been stable.   Headache    This is a recurrent problem. The current episode started more than 1 month ago. The problem occurs daily. Progression since onset: headache is better but the pressure in the back of the head is still present and having some shooting pains from the back of the head and having some fuzzy thinking and with word finding.   The pain is located in the occipital region. Radiates to: neck. The quality of the pain is described as squeezing. Pertinent negatives include no weight loss.     Pressure in the head is 2 months, generally not a headache person, got put on prednisone and not resolved but thinks is better.  Noted to have some increased blood pressure as well.  Says the headache is better but the pressure in the head is not better.  Seen by dr Otho Najjar and recommended to see allergist      Problem List:  Patient Active Problem List   Diagnosis   . 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 Anemia   . Angina pectoris   . Cerebrovascular disease   . Hypothyroidism   . Left bundle branch block (LBBB)   . Palpitations   . Preoperative cardiovascular examination   . CAD involoving bypass graft, S/P CABG x2 2005    . Hypercholesterolemia   . Hypercholesteremia   . Other insomnia   . Cough   . Nonintractable episodic headache, unspecified headache type   . Essential hypertension       Current Medications:  Current Outpatient Prescriptions   Medication Sig Dispense Refill   . aspirin 81 MG chewable tablet Chew by mouth.     . ezetimibe (ZETIA) 10 MG tablet Take 1 tablet (10 mg total) by mouth daily. (Patient taking differently: Take 10 mg by mouth every other day.    ) 30 tablet 5   . levothyroxine (SYNTHROID, LEVOTHROID) 150 MCG tablet TAKE 1 TABLET BY MOUTH ONCE A DAY AT 6:00 AM 90 tablet 1   . nebivolol (BYSTOLIC) 5 MG tablet Take 1 tablet by mouth daily.     . nitroglycerin (NITROSTAT) 0.4 MG SL tablet Place 0.4 mg under the tongue every 5 (five) minutes as needed.         . VENTOLIN HFA 108 (90 Base) MCG/ACT inhaler INL 2 PFS PO TID  0   . zolpidem (AMBIEN CR) 12.5 MG CR tablet Take 1 tablet (12.5 mg total) by mouth nightly as needed for Sleep. 10 tablet 2     No current facility-administered  medications for this visit.        Allergies:  Allergies   Allergen Reactions   . Statins Rash     "Severe Muscle Weakness and Soreness"         Past Medical History:  Past Medical History:   Diagnosis Date   . Carotid stenosis    . Coronary artery disease    . Disorder of thyroid     hypothyroid   . Stented coronary artery        Past Surgical History:  Past Surgical History:   Procedure Laterality Date   . CARDIAC SURGERY  07/2002    cabg x 2   . CORONARY ANGIOPLASTY     . CORONARY ARTERY BYPASS GRAFT     . TONSILLECTOMY         Family History:  Family History   Problem Relation Age of Onset   . Adopted: Yes       Social History:  Social History     Social History   . Marital status: Married     Spouse name: N/A   . Number of children: N/A   . Years of education: N/A     Occupational History   . Not on file.     Social History Main Topics   . Smoking status: Never Smoker   . Smokeless tobacco: Never Used   . Alcohol use 0.0 oz/week    . Drug use: No   . Sexual activity: Not on file     Other Topics Concern   . Not on file     Social History Narrative   . No narrative on file       The following sections were reviewed this encounter by the provider:   Tobacco  Allergies  Meds  Problems  Med Hx  Surg Hx  Fam Hx  Soc Hx          Vitals:  BP 150/82 (BP Site: Left arm, Patient Position: Sitting, Cuff Size: Large)   Pulse 66   Temp 98.9 F (37.2 C) (Oral)   Resp 18   Ht 1.753 m (5\' 9" )   Wt 92.6 kg (204 lb 3.2 oz)   SpO2 97%   BMI 30.16 kg/m       ROS:  Review of Systems   Constitutional: Positive for fatigue (having an issue with mold and thinks is related to that). Negative for weight gain and weight loss.   HENT: Positive for hoarse voice.    Gastrointestinal: Negative for constipation and diarrhea.   Neurological: Positive for headaches. Negative for tremors.   Psychiatric/Behavioral: The patient is nervous/anxious (issue with worry on the mold and headache pressure).           Objective:       Physical Exam:  Physical Exam   Constitutional: She is oriented to person, place, and time. She appears well-developed and well-nourished.   HENT:   Head: Normocephalic and atraumatic.   Right Ear: External ear normal.   Left Ear: External ear normal.   Mouth/Throat: Oropharynx is clear and moist.   Eyes: Pupils are equal, round, and reactive to light. EOM are normal. No scleral icterus.   Neck: Neck supple. No JVD present. No thyromegaly present.   Cardiovascular: Normal rate, regular rhythm and normal heart sounds.    No murmur heard.  Pulmonary/Chest: Effort normal and breath sounds normal. No respiratory distress.   Musculoskeletal: She exhibits no edema.   Lymphadenopathy:  She has no cervical adenopathy.   Neurological: She is alert and oriented to person, place, and time.   Skin: Skin is warm and dry.   Psychiatric: She has a normal mood and affect. Her behavior is normal.   Nursing note and vitals reviewed.          Assessment/Plan:       1. Hypothyroidism, unspecified type  - TSH; Future  - T4, free; Future  - T3, free; Future    66 y.o. y/o female here for routine follow up of hypothyroidism.  Last labs were at goal.  Will refill medications as needed.  discussed signs and symptoms of being over/undermedicated including mood changes, bowel habit changes, palpitations, skin/hair/nail changes among others.  Recommend labs to be drawn on ~6 month intervals to ensure control.    2. Nonintractable episodic headache, unspecified headache type  - CBC and differential; Future  - Comprehensive metabolic panel; Future    66 y/o woman with cough wheezing, headache pressure, discussed sinus, tension not typical for migraine, use of medications to treat the cough, complete the steroid for the lungs and wheezing    3. Cough  - CBC and differential; Future    Discussed antihistamine, treat post nasal drip, less likely cough variant asthma or gerd    4. Essential hypertension  - Microalbumin, Random Urine; Future  - Urinalysis; Future          Gale Journey, MD

## 2017-02-15 ENCOUNTER — Encounter (INDEPENDENT_AMBULATORY_CARE_PROVIDER_SITE_OTHER): Payer: Self-pay | Admitting: Internal Medicine

## 2017-02-20 ENCOUNTER — Encounter (INDEPENDENT_AMBULATORY_CARE_PROVIDER_SITE_OTHER): Payer: Self-pay | Admitting: Internal Medicine

## 2017-02-22 ENCOUNTER — Other Ambulatory Visit (INDEPENDENT_AMBULATORY_CARE_PROVIDER_SITE_OTHER): Payer: Self-pay | Admitting: Internal Medicine

## 2017-02-23 LAB — COMPREHENSIVE METABOLIC PANEL
ALT: 21 IU/L (ref 0–32)
AST (SGOT): 19 IU/L (ref 0–40)
Albumin/Globulin Ratio: 1.9 (ref 1.2–2.2)
Albumin: 4.6 g/dL (ref 3.6–4.8)
Alkaline Phosphatase: 90 IU/L (ref 39–117)
BUN / Creatinine Ratio: 23 (ref 12–28)
BUN: 16 mg/dL (ref 8–27)
Bilirubin, Total: 0.7 mg/dL (ref 0.0–1.2)
CO2: 23 mmol/L (ref 20–29)
Calcium: 9.5 mg/dL (ref 8.7–10.3)
Chloride: 103 mmol/L (ref 96–106)
Creatinine: 0.71 mg/dL (ref 0.57–1.00)
EGFR: 105 mL/min/{1.73_m2} (ref >59)
EGFR: 91 mL/min/{1.73_m2} (ref >59)
Globulin, Total: 2.4 g/dL (ref 1.5–4.5)
Glucose: 91 mg/dL (ref 65–99)
Potassium: 4.1 mmol/L (ref 3.5–5.2)
Protein, Total: 7 g/dL (ref 6.0–8.5)
Sodium: 139 mmol/L (ref 134–144)

## 2017-02-23 LAB — CBC/DIFFERENTIAL (NO PLATELET)
Baso(Absolute): 0 10*3/uL (ref 0.0–0.2)
Basos: 0 %
Eos: 3 %
Eosinophils Absolute: 0.1 10*3/uL (ref 0.0–0.4)
Hematocrit: 41.2 % (ref 34.0–46.6)
Hemoglobin: 13.7 g/dL (ref 11.1–15.9)
Immature Granulocytes Absolute: 0 10*3/uL (ref 0.0–0.1)
Immature Granulocytes: 0 %
Lymphocytes Absolute: 1.4 10*3/uL (ref 0.7–3.1)
Lymphocytes: 31 %
MCH: 29.5 pg (ref 26.6–33.0)
MCHC: 33.3 g/dL (ref 31.5–35.7)
MCV: 89 fL (ref 79–97)
Monocytes Absolute: 0.2 10*3/uL (ref 0.1–0.9)
Monocytes: 5 %
Neutrophils Absolute: 2.9 10*3/uL (ref 1.4–7.0)
Neutrophils: 61 %
RBC: 4.64 x10E6/uL (ref 3.77–5.28)
RDW: 14.1 % (ref 12.3–15.4)
WBC: 4.7 10*3/uL (ref 3.4–10.8)

## 2017-02-23 LAB — URINALYSIS
Bilirubin, UA: NEGATIVE
Blood, UA: NEGATIVE
Glucose, UA: NEGATIVE
Ketones UA: NEGATIVE
Nitrite, UA: POSITIVE — AB
Protein, UA: NEGATIVE
Urine Specific Gravity: 1.02 (ref 1.005–1.030)
Urobilinogen, Ur: 0.2 mg/dL (ref 0.2–1.0)
pH, UA: 6 (ref 5.0–7.5)

## 2017-02-23 LAB — REFLEX - MICROSCOPIC EXAMINATION
Casts, UA: NONE SEEN /lpf
Epithelial Cells (non renal): >10 /hpf — AB (ref 0–10)
RBC, UA: NONE SEEN /hpf (ref 0–?)

## 2017-02-23 LAB — T3, FREE: T3, Free: 3 pg/mL (ref 2.0–4.4)

## 2017-02-23 LAB — T4, FREE: T4, Free: 1.31 ng/dL (ref 0.82–1.77)

## 2017-02-23 LAB — MICROALBUMIN, RANDOM URINE WITHOUT CREATININE: Microalbumin, UR: 24 ug/mL

## 2017-02-23 LAB — AMBIG ABBREV CMP14 DEFAULT

## 2017-02-23 LAB — TSH: TSH: 0.786 u[IU]/mL (ref 0.450–4.500)

## 2017-03-17 NOTE — Progress Notes (Signed)
Results of the labs were normal except the urinalysis that had the epithelial cells and only a few bacteria.

## 2017-03-18 ENCOUNTER — Telehealth (INDEPENDENT_AMBULATORY_CARE_PROVIDER_SITE_OTHER): Payer: Self-pay | Admitting: Internal Medicine

## 2017-03-18 ENCOUNTER — Ambulatory Visit (INDEPENDENT_AMBULATORY_CARE_PROVIDER_SITE_OTHER): Payer: No Typology Code available for payment source | Admitting: Neurology

## 2017-03-18 VITALS — BP 148/85 | HR 67 | Resp 14 | Ht 69.0 in | Wt 203.0 lb

## 2017-03-18 DIAGNOSIS — R519 Headache, unspecified: Secondary | ICD-10-CM

## 2017-03-18 DIAGNOSIS — R51 Headache: Secondary | ICD-10-CM

## 2017-03-18 NOTE — Progress Notes (Signed)
Subjective:       Patient ID: Teresa Briggs is a 66 y.o. female.    HPI    The patient is a pleasant 66 years old right-handed Caucasian female who is here for evaluation of new onset of headaches as well as pressure-like feeling in the occipital region.  The patient reports the symptoms started about a few weeks ago and lasted about 3 weeks, followed by resolution of headache, but after a few days.  The headaches did come back.  They are pressure-like feeling in the occipital region is persistent, she denies having any photophobia, phonophobia, nausea, vomiting, dizziness or lightheadedness.  She reports having intermittent episodes of left foot and toe numbness with a history of coronary artery bypass graft and carotid artery endarterectomy.  She also reports having a cough which is chronic and without any diagnosis, being evaluated by pulmonologist and getting the ALLERGY evaluation.  She denies having any sickness, flulike symptoms, neck pain, stress, anxiety or depression that may have brought on the pressure of the headaches.  Currently, her headaches have improved with a pressure-like feeling, is still there.  She does have significant cardiac history and was recently started on blood pressure medication.  She took the medication once and did not like the side effects, so has not been taking any.  She reports having difficulty sleeping, unable to sleep throughout the night.  Has started taking Ambien in the past, which helped, but she was told not to take it on daily basis.  She has tried melatonin and other over-the-counter sleeping medications without any help.      Allergies   Allergen Reactions   . Statins Rash     "Severe Muscle Weakness and Soreness"       Past Medical History:   Diagnosis Date   . Carotid stenosis    . Coronary artery disease    . Disorder of thyroid     hypothyroid   . Stented coronary artery        Current Outpatient Prescriptions on File Prior to Visit   Medication Sig  Dispense Refill   . aspirin 81 MG chewable tablet Chew by mouth.     . ezetimibe (ZETIA) 10 MG tablet Take 1 tablet (10 mg total) by mouth daily. (Patient taking differently: Take 10 mg by mouth every other day.    ) 30 tablet 5   . levothyroxine (SYNTHROID, LEVOTHROID) 150 MCG tablet TAKE 1 TABLET BY MOUTH ONCE A DAY AT 6:00 AM 90 tablet 1   . zolpidem (AMBIEN CR) 12.5 MG CR tablet Take 1 tablet (12.5 mg total) by mouth nightly as needed for Sleep. 10 tablet 2   . nitroglycerin (NITROSTAT) 0.4 MG SL tablet Place 0.4 mg under the tongue every 5 (five) minutes as needed.         . VENTOLIN HFA 108 (90 Base) MCG/ACT inhaler INL 2 PFS PO TID  0   . [DISCONTINUED] nebivolol (BYSTOLIC) 5 MG tablet Take 1 tablet by mouth daily.       No current facility-administered medications on file prior to visit.        Social History     Social History   . Marital status: Married     Spouse name: N/A   . Number of children: N/A   . Years of education: N/A     Occupational History   . Not on file.     Social History Main Topics   . Smoking status: Never  Smoker   . Smokeless tobacco: Never Used   . Alcohol use 0.0 oz/week   . Drug use: No   . Sexual activity: Not on file     Other Topics Concern   . Not on file     Social History Narrative   . No narrative on file       The following portions of the patient's history were reviewed and updated as appropriate: allergies, current medications, past family history, past medical history, past social history, past surgical history and problem list.    Review of Systems   Constitutional: Positive for activity change and fatigue.   HENT: Negative.    Eyes: Negative.    Respiratory: Negative.    Cardiovascular: Negative.    Gastrointestinal: Negative.    Endocrine: Negative.    Genitourinary: Negative.    Musculoskeletal: Positive for back pain and neck pain.   Skin: Negative.    Allergic/Immunologic: Negative.    Neurological: Positive for headaches.   Hematological: Negative.     Psychiatric/Behavioral: Positive for sleep disturbance. The patient is nervous/anxious.    All other systems were reviewed and are negative except as previously note din the HPI.            Objective:    Physical Exam    Vitals:    03/18/17 1105   BP: 148/85   Pulse: 67   Resp: 14       Constitutional: Vital signs reviewed.   Head: Normocephalic, atraumatic, neck supple no JVD  Eyes: No conjunctival injection. No discharge.   ENT: Mucous membranes moist   Neck: Normal range of motion. Non tender.   Respiratory/Chest: Clear to auscultation. No respiratory distress.   Cardiovascular: Regular rate and rhythm. No murmur.   Lower Extremity: No edema. No cyanosis.   Skin: Warm and dry. No rash.   Lymphatic: No cervical lymphadenopathy.   Psychiatric: Normal affect.     Mental Status: The patient was awake, alert, and oriented. Conversation   was appropriate. Speech was fluent, and the patient followed commands   consistently.  Cranial Nerves: Pupils were equally round and reactive to light. Extraocular  movements were intact, without nystagmus. The patient's face was symmetric,   and facial sensation was intact. Tongue and palate were midline.   Motor Exam: The patient had full strength throughout. Tone and bulk appeared   normal. There were no abnormal movements or tremor noted.   Sensation: Light touch, pinprick, and joint position sense were intact.   Coordination: Finger-to-nose and heel-to-shin testing was intact   without dysmetria. Romberg testing was negative.   Gait: Normal, with normal tandem walking.   Deep Tendon Reflexes: 2+ and symmetric throughout.   Toes were downgoing.        Assessment:       66 years old female presenting with new onset of pressure-like feeling and headaches without any focal neurological deficit.  She has been noted to have high blood pressure, but not taking the medication because of some side effects.  The symptoms could be related to stress, lack of sleep or high blood pressure.   She was recommended to talk to her Cardiologist and change the medication and monitor if the symptoms of headaches and pressure-like feeling resolve by managing the blood pressure.  If the symptoms continue to persist, then will get the MRI brain in 3 weeks.  She will follow-up with [place] in 1 month after the MRI brain.  Plan:      Procedures  Orders Placed This Encounter   Procedures   . MRI Brain WO Contrast     Standing Status:   Future     Standing Expiration Date:   03/18/2018     Order Specific Question:   Does the patient have a pacemaker or defibrillator?     Answer:   No     Order Specific Question:   Clinical info for radiologist     Answer:   new onset of headache       The patient will return in follow-up as outlined above. If there are any new neurologic symptoms or worsening of current symptoms, the patient is instructed to contact us by telephone.    Lynann Bologna, MD  Neurology, Neurophysiology  [facility]

## 2017-03-18 NOTE — Progress Notes (Signed)
Is the patient taking current medications?Yes  Has there been any changes to current medication list?no change    Document any barriers/adverse reactions to current medicationsnone  CURRENT BARRIERS TO TAKING MEDICATION:NO    Has the patient sought any care outside of the [facility]?No

## 2017-03-26 ENCOUNTER — Telehealth (INDEPENDENT_AMBULATORY_CARE_PROVIDER_SITE_OTHER): Payer: Self-pay | Admitting: Internal Medicine

## 2017-03-26 NOTE — Telephone Encounter (Signed)
Patient also spoke with [facility] nurse in regards to lab results from 02/22/17.  Writer read and went over/reviewed certain lab results and what they meant as well as MD's note to patient.  Patient was just a little concerned about abnormalities found in urinalysis.  However, education was given and patient verbalized understanding.  Other than that, she had no other questions or concerns at this time.  Please follow up with patient if needed.  Thank you!

## 2017-03-26 NOTE — Telephone Encounter (Signed)
Patient is requesting an order for mammogram be re ordered and sent to :    [facility]  [address] [place], [place], [place] [address]  Phone number:: [phone]      Please refer back to encounter from 08/27/2016 regarding order.    Best phone number to reach patient will be  [phone]  Thank you!

## 2017-04-04 ENCOUNTER — Other Ambulatory Visit (INDEPENDENT_AMBULATORY_CARE_PROVIDER_SITE_OTHER): Payer: Self-pay | Admitting: Internal Medicine

## 2017-04-04 DIAGNOSIS — Z1239 Encounter for other screening for malignant neoplasm of breast: Secondary | ICD-10-CM

## 2017-04-04 NOTE — Telephone Encounter (Signed)
Asked Dr. Boneta Lucks to place order for 6 month f/u mammogram.  Dr. Boneta Lucks placed mammogram order.  Called patient and advised her of order.  She verified understanding and said she would try and call tomorrow for an appointment.

## 2017-04-04 NOTE — Telephone Encounter (Signed)
Theotis Burrow, RN         03/26/17 9:16 AM   Note      Patient also spoke with [facility] nurse in regards to lab results from 02/22/17.  Writer read and went over/reviewed certain lab results and what they meant as well as MD's note to patient.  Patient was just a little concerned about abnormalities found in urinalysis.  However, education was given and patient verbalized understanding.  Other than that, she had no other questions or concerns at this time.  Please follow up with patient if needed.  Thank you!

## 2017-04-09 ENCOUNTER — Telehealth (INDEPENDENT_AMBULATORY_CARE_PROVIDER_SITE_OTHER): Payer: Self-pay | Admitting: Internal Medicine

## 2017-04-09 NOTE — Telephone Encounter (Signed)
Pt called stating the [facility] office informed that she can not have 2 mammogram screenings a year. The mammogram order needs to be corrected not to be screening.    Pt can best be reached at [phone]

## 2017-04-09 NOTE — Telephone Encounter (Signed)
Patient contacted. Patient will be having the mammogram done at [facility] across the hall from [place]. She can not have two mammogram screenings done in one year so the order needs to be changed to diagnostic follow up of right breast. Can you change order?     Right now the order is placed as mammo digital screening right w cad.

## 2017-04-10 ENCOUNTER — Other Ambulatory Visit (INDEPENDENT_AMBULATORY_CARE_PROVIDER_SITE_OTHER): Payer: Self-pay | Admitting: Internal Medicine

## 2017-04-10 DIAGNOSIS — R928 Other abnormal and inconclusive findings on diagnostic imaging of breast: Secondary | ICD-10-CM

## 2017-04-11 ENCOUNTER — Ambulatory Visit (INDEPENDENT_AMBULATORY_CARE_PROVIDER_SITE_OTHER): Payer: No Typology Code available for payment source | Admitting: Neurology

## 2017-04-17 ENCOUNTER — Other Ambulatory Visit (INDEPENDENT_AMBULATORY_CARE_PROVIDER_SITE_OTHER): Payer: Self-pay | Admitting: Internal Medicine

## 2017-04-17 DIAGNOSIS — R928 Other abnormal and inconclusive findings on diagnostic imaging of breast: Secondary | ICD-10-CM

## 2017-04-30 ENCOUNTER — Ambulatory Visit: Payer: No Typology Code available for payment source

## 2017-04-30 ENCOUNTER — Ambulatory Visit: Payer: No Typology Code available for payment source | Attending: Internal Medicine

## 2017-04-30 DIAGNOSIS — R928 Other abnormal and inconclusive findings on diagnostic imaging of breast: Secondary | ICD-10-CM | POA: Insufficient documentation

## 2017-04-30 NOTE — Progress Notes (Signed)
Noted no significant change and recommends bilateral screen in 6 months (called and went over results)

## 2017-06-12 ENCOUNTER — Other Ambulatory Visit (INDEPENDENT_AMBULATORY_CARE_PROVIDER_SITE_OTHER): Payer: Self-pay | Admitting: Internal Medicine

## 2017-06-12 DIAGNOSIS — G4709 Other insomnia: Secondary | ICD-10-CM

## 2017-06-12 MED ORDER — ZOLPIDEM TARTRATE ER 12.5 MG PO TBCR
12.5000 mg | EXTENDED_RELEASE_TABLET | Freq: Every evening | ORAL | 2 refills | Status: DC | PRN
Start: 2017-06-12 — End: 2017-06-15

## 2017-06-12 NOTE — Telephone Encounter (Signed)
Sent it

## 2017-06-12 NOTE — Telephone Encounter (Signed)
Pt called for a refill on  zolpidem (AMBIEN CR) 12.5 MG CR tablet    Please send it to   [facility] # [address], [place] - [address] [address] [phone] (Phone)  [phone] (Fax)

## 2017-06-15 ENCOUNTER — Other Ambulatory Visit (INDEPENDENT_AMBULATORY_CARE_PROVIDER_SITE_OTHER): Payer: Self-pay | Admitting: Internal Medicine

## 2017-06-15 DIAGNOSIS — G4709 Other insomnia: Secondary | ICD-10-CM

## 2017-06-17 NOTE — Telephone Encounter (Signed)
faxed

## 2017-08-03 ENCOUNTER — Encounter (INDEPENDENT_AMBULATORY_CARE_PROVIDER_SITE_OTHER): Payer: Self-pay

## 2017-08-19 ENCOUNTER — Other Ambulatory Visit (INDEPENDENT_AMBULATORY_CARE_PROVIDER_SITE_OTHER): Payer: Self-pay | Admitting: Internal Medicine

## 2017-08-19 DIAGNOSIS — E039 Hypothyroidism, unspecified: Secondary | ICD-10-CM

## 2017-08-19 MED ORDER — LEVOTHYROXINE SODIUM 150 MCG PO TABS
ORAL_TABLET | ORAL | 1 refills | Status: DC
Start: 2017-08-19 — End: 2018-12-01

## 2017-08-19 NOTE — Telephone Encounter (Signed)
Last ov 05/2017

## 2017-08-19 NOTE — Telephone Encounter (Signed)
Refill sent.

## 2017-08-19 NOTE — Telephone Encounter (Signed)
Patient is requesting a refill of the following medication:    levothyroxine (SYNTHROID, LEVOTHROID) 150 MCG tablet    Preferred phamacy:  [facility] # [address] [place] - [address]  [phone] (Phone)  [phone] (Fax)

## 2017-10-09 ENCOUNTER — Encounter (INDEPENDENT_AMBULATORY_CARE_PROVIDER_SITE_OTHER): Payer: Self-pay | Admitting: Internal Medicine

## 2018-06-19 ENCOUNTER — Encounter (INDEPENDENT_AMBULATORY_CARE_PROVIDER_SITE_OTHER): Payer: Self-pay | Admitting: Internal Medicine

## 2018-07-03 ENCOUNTER — Encounter (INDEPENDENT_AMBULATORY_CARE_PROVIDER_SITE_OTHER): Payer: Self-pay | Admitting: Internal Medicine

## 2018-11-27 ENCOUNTER — Telehealth (INDEPENDENT_AMBULATORY_CARE_PROVIDER_SITE_OTHER): Payer: Self-pay

## 2018-11-27 NOTE — Telephone Encounter (Signed)
Not our pt please advise. Thank you

## 2018-11-27 NOTE — Telephone Encounter (Signed)
Pt was returning call to talk to a nurse regarding the COVID PS for her OV on Monday!    Please give her a call back!

## 2018-11-27 NOTE — Telephone Encounter (Signed)
LVM to complete PS

## 2018-12-01 ENCOUNTER — Ambulatory Visit (FREE_STANDING_LABORATORY_FACILITY): Payer: BC Managed Care – PPO | Admitting: Family

## 2018-12-01 ENCOUNTER — Encounter (INDEPENDENT_AMBULATORY_CARE_PROVIDER_SITE_OTHER): Payer: Self-pay | Admitting: Family

## 2018-12-01 VITALS — BP 146/84 | HR 62 | Temp 98.6°F | Resp 16 | Ht 69.0 in | Wt 204.0 lb

## 2018-12-01 DIAGNOSIS — Z23 Encounter for immunization: Secondary | ICD-10-CM

## 2018-12-01 DIAGNOSIS — I10 Essential (primary) hypertension: Secondary | ICD-10-CM

## 2018-12-01 DIAGNOSIS — Z0000 Encounter for general adult medical examination without abnormal findings: Secondary | ICD-10-CM

## 2018-12-01 DIAGNOSIS — E7800 Pure hypercholesterolemia, unspecified: Secondary | ICD-10-CM

## 2018-12-01 DIAGNOSIS — I6521 Occlusion and stenosis of right carotid artery: Secondary | ICD-10-CM

## 2018-12-01 DIAGNOSIS — Z6830 Body mass index (BMI) 30.0-30.9, adult: Secondary | ICD-10-CM

## 2018-12-01 DIAGNOSIS — Z7689 Persons encountering health services in other specified circumstances: Secondary | ICD-10-CM

## 2018-12-01 DIAGNOSIS — Z1159 Encounter for screening for other viral diseases: Secondary | ICD-10-CM

## 2018-12-01 DIAGNOSIS — G4709 Other insomnia: Secondary | ICD-10-CM

## 2018-12-01 DIAGNOSIS — E039 Hypothyroidism, unspecified: Secondary | ICD-10-CM

## 2018-12-01 DIAGNOSIS — E669 Obesity, unspecified: Secondary | ICD-10-CM | POA: Insufficient documentation

## 2018-12-01 DIAGNOSIS — I2510 Atherosclerotic heart disease of native coronary artery without angina pectoris: Secondary | ICD-10-CM

## 2018-12-01 DIAGNOSIS — Z1239 Encounter for other screening for malignant neoplasm of breast: Secondary | ICD-10-CM

## 2018-12-01 LAB — CBC AND DIFFERENTIAL
Absolute NRBC: 0 10*3/uL (ref 0.00–0.00)
Basophils Absolute Automated: 0.03 10*3/uL (ref 0.00–0.08)
Basophils Automated: 0.5 %
Eosinophils Absolute Automated: 0.19 10*3/uL (ref 0.00–0.44)
Eosinophils Automated: 3.3 %
Hematocrit: 43 % (ref 34.7–43.7)
Hgb: 14.1 g/dL (ref 11.4–14.8)
Immature Granulocytes Absolute: 0.01 10*3/uL (ref 0.00–0.07)
Immature Granulocytes: 0.2 %
Lymphocytes Absolute Automated: 1.72 10*3/uL (ref 0.42–3.22)
Lymphocytes Automated: 29.5 %
MCH: 29.5 pg (ref 25.1–33.5)
MCHC: 32.8 g/dL (ref 31.5–35.8)
MCV: 90 fL (ref 78.0–96.0)
MPV: 11.3 fL (ref 8.9–12.5)
Monocytes Absolute Automated: 0.43 10*3/uL (ref 0.21–0.85)
Monocytes: 7.4 %
Neutrophils Absolute: 3.45 10*3/uL (ref 1.10–6.33)
Neutrophils: 59.1 %
Nucleated RBC: 0 /100 WBC (ref 0.0–0.0)
Platelets: 222 10*3/uL (ref 142–346)
RBC: 4.78 10*6/uL (ref 3.90–5.10)
RDW: 13 % (ref 11–15)
WBC: 5.83 10*3/uL (ref 3.10–9.50)

## 2018-12-01 LAB — COMPREHENSIVE METABOLIC PANEL
ALT: 22 U/L (ref 0–55)
AST (SGOT): 21 U/L (ref 5–34)
Albumin/Globulin Ratio: 1.7 (ref 0.9–2.2)
Albumin: 4.7 g/dL (ref 3.5–5.0)
Alkaline Phosphatase: 89 U/L (ref 37–106)
Anion Gap: 11 (ref 5.0–15.0)
BUN: 15 mg/dL (ref 7.0–19.0)
Bilirubin, Total: 0.8 mg/dL (ref 0.2–1.2)
CO2: 24 mEq/L (ref 21–29)
Calcium: 9.4 mg/dL (ref 8.5–10.5)
Chloride: 106 mEq/L (ref 100–111)
Creatinine: 0.7 mg/dL (ref 0.4–1.5)
Globulin: 2.7 g/dL (ref 2.0–3.7)
Glucose: 83 mg/dL (ref 70–100)
Potassium: 4.4 mEq/L (ref 3.5–5.1)
Protein, Total: 7.4 g/dL (ref 6.0–8.3)
Sodium: 141 mEq/L (ref 136–145)

## 2018-12-01 LAB — URINALYSIS
Bilirubin, UA: NEGATIVE
Blood, UA: NEGATIVE
Glucose, UA: NEGATIVE
Ketones UA: NEGATIVE
Nitrite, UA: POSITIVE — AB
Specific Gravity UA: 1.024 (ref 1.001–1.035)
Urine pH: 5.5 (ref 5.0–8.0)
Urobilinogen, UA: 0.2 (ref 0.2–2.0)

## 2018-12-01 LAB — T3, FREE: T3, Free: 3.26 pg/mL (ref 1.71–3.71)

## 2018-12-01 LAB — LIPID PANEL
Cholesterol / HDL Ratio: 4.5
Cholesterol: 187 mg/dL (ref 0–199)
HDL: 42 mg/dL (ref 40–9999)
LDL Calculated: 107 mg/dL — ABNORMAL HIGH (ref 0–99)
Triglycerides: 192 mg/dL — ABNORMAL HIGH (ref 34–149)
VLDL Calculated: 38 mg/dL (ref 10–40)

## 2018-12-01 LAB — HEMOGLOBIN A1C
Average Estimated Glucose: 99.7 mg/dL
Hemoglobin A1C: 5.1 % (ref 4.6–5.9)

## 2018-12-01 LAB — HEPATITIS C ANTIBODY: Hepatitis C, AB: NONREACTIVE

## 2018-12-01 LAB — URINE MICROSCOPIC

## 2018-12-01 LAB — TSH: TSH: 0.66 u[IU]/mL (ref 0.35–4.94)

## 2018-12-01 LAB — HEMOLYSIS INDEX: Hemolysis Index: 8 (ref 0–18)

## 2018-12-01 LAB — T4, FREE: T4 Free: 1.22 ng/dL (ref 0.70–1.48)

## 2018-12-01 LAB — GFR: EGFR: >60

## 2018-12-01 MED ORDER — LEVOTHYROXINE SODIUM 150 MCG PO TABS
ORAL_TABLET | ORAL | 3 refills | Status: AC
Start: 2018-12-01 — End: ?

## 2018-12-01 NOTE — Progress Notes (Signed)
Have you seen any specialists/other providers since your last visit with [place]?    Yes    Arm preference verified?   Yes    The patient is due for mammogram, falls risk screening, shingles vaccine, pneumonia vaccine and Hep C Screening

## 2018-12-01 NOTE — Progress Notes (Signed)
[facility] [place]    PROGRESS NOTE      Patient: Teresa Briggs   Date: 12/01/2018   MRN: [id number]     Past Medical History:   Diagnosis Date    Carotid stenosis     Coronary artery disease     Disorder of thyroid     hypothyroid    Scoliosis     Stented coronary artery      Social History     Socioeconomic History    Marital status: Married     Spouse name: Not on file    Number of children: Not on file    Years of education: Not on file    Highest education level: Not on file   Occupational History    Not on file   Social Needs    Financial resource strain: Not on file    Food insecurity     Worry: Not on file     Inability: Not on file    Transportation needs     Medical: Not on file     Non-medical: Not on file   Tobacco Use    Smoking status: Never Smoker    Smokeless tobacco: Never Used   Substance and Sexual Activity    Alcohol use: Yes     Alcohol/week: 0.0 standard drinks    Drug use: No    Sexual activity: Not on file   Lifestyle    Physical activity     Days per week: Not on file     Minutes per session: Not on file    Stress: Not on file   Relationships    Social connections     Talks on phone: Not on file     Gets together: Not on file     Attends religious service: Not on file     Active member of club or organization: Not on file     Attends meetings of clubs or organizations: Not on file     Relationship status: Not on file    Intimate partner violence     Fear of current or ex partner: Not on file     Emotionally abused: Not on file     Physically abused: Not on file     Forced sexual activity: Not on file   Other Topics Concern    Not on file   Social History Narrative    Not on file     Family History   Adopted: Yes           ASSESSMENT/PLAN     Teresa Briggs is a 68 y.o. female    Chief Complaint   Patient presents with    Annual Exam     She is fasting. No pap or breast exam.     Medication Refill     Needs a refill for Synthroid, Medication  is working well,  been taking all her life and been on the same dose for a long time.         1. Annual physical exam  - CBC and differential  - Comprehensive metabolic panel  - Lipid panel  - Urinalysis  - Hemoglobin A1C    Health promotion and disease prevention/screening recommendations discussed.    Exercise: Optimize aerobic exercise per ACC guidelines of 150 min per week.     Healthy eating: Aim for (8) 8 oz glasses  of water per day. Dietary fiber 25 grams per day. Optimize weight by eating a  well balanced diet and encouraged daily calcium and vitamin D. Heart healthy diet encouraged, such as limiting saturated fat and trans fat and replacing these fats with the poly and mono unsaturated fats. Plant sterols, fish oil, fiber, and sodium reduction is ideal.     Sleep hygiene: Recommendations for optimizing sleep hygiene includes: counseled on sleep hygiene (getting 6 hrs sleep minimum) 1) regular sleep times 2) Avoid caffeine 4-6 hours prior to going to bed 3) Avoid alcohol for at least 4-6 hours before going to bed 4) Engage in regular exercise 5) Keep daytime routine activities the same as planned 6) Stop screen time/viewing 1 hours prior to bedtime and 7) eat a well-balance diet, preferrably eat dinner no later than 2-3 hours prior to going to sleep.Reviewed labs with patient. Patient agreeable to plan.     Safety: seat belts, no texting and driving, no drinking and driving, no riding with people who have been drinking, moderation (calories, caffeine, alcohol etc), domestic violence awareness, recommend annual flu shots.    2. Hypothyroidism, unspecified type  - TSH  - T3, free  - T4, free  - levothyroxine (SYNTHROID) 150 MCG tablet; TAKE 1 TABLET BY MOUTH ONCE A DAY AT 6:00 AM  Dispense: 90 tablet; Refill: 3    Chronic and has been stable. Will recheck labs and if normal f/u every year or sooner if any concerns    3. CAD involoving bypass graft, S/P CABG x2 2005  Followed by cardiologist    4. Stenosis of  right carotid artery  Followed by cardiologist    5. Hypercholesterolemia  - Lipid panel    Followed by cardiologist    6. Essential hypertension  - Comprehensive metabolic panel    Followed by cardiologist    7. Other insomnia    Followed by cardiologist    8. Obesity (BMI 30-39.9)  - Lipid panel  - Hemoglobin A1C    Discussed the patient's BMI with her.  The BMI is above average; BMI management plan is completed.      9. Encounter to establish care    10. Need for vaccination for Strep pneumoniae  - Pneumococcal polysaccharide vaccine 23-valent greater than or equal to 2yo subcutaneous/IM    11. Need for hepatitis C screening test  - Hepatitis C (HCV) antibody, Total    12. Screening for breast cancer  - Mammo Digital Screening Bilateral W Cad; Future         Health maintenance:  -Fasting blood work: we reviewed in office today and pt agreeable to labs ordered  -Immunizations: pneumonia 23 today, shingles when available. Check on records for last TD  -STD screen: declined need as no concerns  -Mammogram: encouraged self breast exams- order given  -DEXA: due at 70  -Colonoscopy: due 2025  -Vision: encouraged to schedule  -Dental: UTD         Risk & Benefits of the new medication(s) were explained to the patient (and family) who verbalized understanding & agreed to the treatment plan. Patient (family) encouraged to contact me/clinical staff with any questions/concerns      MEDICATIONS     Current Outpatient Medications   Medication Sig Dispense Refill    aspirin 81 MG chewable tablet Chew by mouth.      carvedilol (COREG) 6.25 MG tablet Take 6.25 mg by mouth 2 (two) times daily         ezetimibe (ZETIA) 10 MG tablet Take 1 tablet (10 mg total) by mouth daily. (Patient taking  differently: Take 10 mg by mouth every other day.    ) 30 tablet 5    levothyroxine (SYNTHROID) 150 MCG tablet TAKE 1 TABLET BY MOUTH ONCE A DAY AT 6:00 AM 90 tablet 3    lisinopril (ZESTRIL) 20 MG tablet Take 20 mg by mouth daily       zolpidem (AMBIEN CR) 12.5 MG CR tablet TAKE ONE TABLET BY MOUTH NIGHTLY AS NEEDED FOR SLEEP 10 tablet 1    fluticasone (FLONASE ALLERGY RELIEF) 50 MCG/ACT nasal spray 1 spray by Nasal route daily.      VENTOLIN HFA 108 (90 Base) MCG/ACT inhaler INL 2 PFS PO TID  0     No current facility-administered medications for this visit.        Allergies   Allergen Reactions    Statins Rash     "Severe Muscle Weakness and Soreness"         SUBJECTIVE     Chief Complaint   Patient presents with    Annual Exam     She is fasting. No pap or breast exam.     Medication Refill     Needs a refill for Synthroid, Medication is working well,  been taking all her life and been on the same dose for a long time.         HPI    68 year old female presents to establish care as new to me and previous PCP has left [facility].     She is also overdue for annual physical today and would like to do physical.     Problem   Obesity (Bmi 30-39.9)   Essential Hypertension    Followed by cardiologist     Other Insomnia    Chronic and currently taking Ambien but only PRN and usually only takes 1/2 tablet and usually only needs once a month and rx by her cardiologist       CAD involoving bypass graft, S/P CABG x2 2005    Followed by cardiologist     Hypothyroidism    Chronic and dx over 40 years ago.  Symptoms at dx: extreme fatigue, weight gain, hair loss  Ultrasound years ago in the past and no concerns  No current symptoms and has been well controlled on 150 mcg daily.     Stenosis of right carotid artery, S/P stent to right ICA 05/2013    Followed by cardiologist       Visit Type: Health Maintenance Visit home with spouse- part-time in [place]- 3 adult children- 1 [place], 1 in [place], 1 in [place], has 4 grandchildren.  Work Status: retired since 2015, low stress currently  Reported Health: good health (despite bypass at 52 and stenting at 77)  Diet: well-balanced diet  Exercise: not a lot due to arthritis in knees- limits ability- does ride her  bike some  Dental: regular dental visits twice a year  Vision: contact lenses- overdue for eye exam  Hearing: normal hearing  Immunization Status: shingles when available. Will get records as unsure tetanus.  Prior Screening Tests: pap smear > 1 year ago and mammogram > 1 year ago  General Health Risks: no family history of colon cancer and no family history of breast cancer  Safety Elements Used: uses seat belts    ROS     Review of Systems   Constitutional: Negative for fever, chills, diaphoresis, activity change, appetite change, fatigue and unexpected weight change.   HENT: Negative for congestion, dental problem, ear pain,  hearing loss, mouth sores, sneezing and sore throat.    Eyes: Negative for pain and visual disturbance.   Respiratory: Negative for cough, chest tightness, shortness of breath and wheezing.    Cardiovascular: Negative for chest pain, palpitations and leg swelling.   Gastrointestinal: Negative for nausea, vomiting, abdominal pain, diarrhea, constipation and blood in stool.   Endocrine: Negative for cold intolerance, heat intolerance, polydipsia, polyphagia and polyuria.   Genitourinary: Negative for dysuria, urgency, frequency, hematuria, flank pain and difficulty urinating.        No breast masses or nipple discharge  No abnormal vaginal bleeding or discharge   Musculoskeletal: knee pain and saw specialist in the past  Skin: Negative for rash and wound.   Neurological: Negative for dizziness, tremors, syncope, weakness, light-headedness, numbness and headaches.   Hematological: Negative for adenopathy. Does not bruise/bleed easily.   Psychiatric/Behavioral: Negative for sleep disturbance and dysphoric mood. The patient is not nervous/anxious.        The following portions of the patient's history were reviewed and updated as appropriate: Allergies, Current Medications, Past Family History, Past Medical history, Past social history, Past surgical history, and Problem List.      PHYSICAL EXAM        Vitals:    12/01/18 0920 12/01/18 0928   BP: 151/83 146/84   BP Site: Left arm Right arm   Patient Position: Sitting Sitting   Cuff Size: Large Large   Pulse: 69 62   Resp: 16    Temp: 98.6 F (37 C)    TempSrc: Temporal    SpO2: 98%    Weight: 92.5 kg (204 lb)    Height: 1.753 m (5\' 9" )      Body mass index is 30.13 kg/m.     Physical Exam   Nursing note and vitals reviewed.  Constitutional: She is oriented to person, place, and time. She appears well-developed and well-nourished. No distress.   HENT:   Head: Normocephalic and atraumatic.   Right Ear: Tympanic membrane and external ear normal.   Left Ear: Tympanic membrane and external ear normal.   Nose: Nose normal. No mucosal edema.   Mouth/Throat: Uvula is midline, oropharynx is clear and moist and mucous membranes are normal. No oropharyngeal exudate, posterior oropharyngeal edema or posterior oropharyngeal erythema.   Eyes: Conjunctivae and EOM are normal. Pupils are equal, round, and reactive to light.   Neck: Normal range of motion. Neck supple. No thyromegaly present.   Cardiovascular: Normal rate, regular rhythm and normal heart sounds.  Exam reveals no gallop and no friction rub.    No murmur heard.  Pulmonary/Chest: Effort normal and breath sounds normal. No respiratory distress. She has no wheezes. She has no rales.   Declined breast exam  Abdominal: Soft. Bowel sounds are normal. She exhibits no distension and no mass. There is no hepatosplenomegaly. There is no tenderness.   Genitourinary: declined need for exam.  Musculoskeletal: Normal range of motion. She exhibits no edema.   Lymphadenopathy:     She has no cervical adenopathy.   Neurological: She is alert and oriented to person, place, and time. She has normal strength and normal reflexes. She displays no atrophy and no tremor. No cranial nerve deficit or sensory deficit. She exhibits normal muscle tone. Coordination and gait normal.   Reflex Scores:       Patellar reflexes are 2+ on the  right side and 2+ on the left side.  Skin: Skin is warm and dry. No rash noted.  Psychiatric: She has a normal mood and affect. Her speech is normal and behavior is normal. Judgment and thought content normal. Cognition and memory are normal.     Results for orders placed or performed in visit on 02/22/17   CBC/Differential (No Platelet)   Result Value Ref Range    WBC 4.7 3.4 - 10.8 x10E3/uL    RBC 4.64 3.77 - 5.28 x10E6/uL    Hemoglobin 13.7 11.1 - 15.9 g/dL    Hematocrit 16.1 09.6 - 46.6 %    MCV 89 79 - 97 fL    MCH 29.5 26.6 - 33.0 pg    MCHC 33.3 31.5 - 35.7 g/dL    RDW 04.5 40.9 - 81.1 %    Neutrophils 61 Not Estab. %    Lymphocytes 31 Not Estab. %    Monocytes 5 Not Estab. %    Eos 3 Not Estab. %    Basos 0 Not Estab. %    Neutrophils Absolute 2.9 1.4 - 7.0 x10E3/uL    Lymphocytes Absolute 1.4 0.7 - 3.1 x10E3/uL    Monocytes Absolute 0.2 0.1 - 0.9 x10E3/uL    Eosinophils Absolute 0.1 0.0 - 0.4 x10E3/uL    Baso(Absolute) 0.0 0.0 - 0.2 x10E3/uL    Immature Granulocytes 0 Not Estab. %    Immature Granulocytes Absolute 0.0 0.0 - 0.1 x10E3/uL   Comprehensive metabolic panel   Result Value Ref Range    Glucose 91 65 - 99 mg/dL    BUN 16 8 - 27 mg/dL    Creatinine 9.14 7.82 - 1.00 mg/dL    EGFR 91 >95 AO/ZHY/8.65    EGFR 105 >59 mL/min/1.73    BUN / Creatinine Ratio 23 12 - 28    Sodium 139 134 - 144 mmol/L    Potassium 4.1 3.5 - 5.2 mmol/L    Chloride 103 96 - 106 mmol/L    CO2 23 20 - 29 mmol/L    Calcium 9.5 8.7 - 10.3 mg/dL    Protein, Total 7.0 6.0 - 8.5 g/dL    Albumin 4.6 3.6 - 4.8 g/dL    Globulin, Total 2.4 1.5 - 4.5 g/dL    Albumin/Globulin Ratio 1.9 1.2 - 2.2    Bilirubin, Total 0.7 0.0 - 1.2 mg/dL    Alkaline Phosphatase 90 39 - 117 IU/L    AST (SGOT) 19 0 - 40 IU/L    ALT 21 0 - 32 IU/L   Urinalysis   Result Value Ref Range    Urine Specific Gravity 1.020 1.005 - 1.030    pH, UA 6.0 5.0 - 7.5    Color, UA Yellow Yellow    Appearance: Cloudy (A) Clear    Leukocyte Esterase, UA 3+ (A) Negative     Protein, UA Negative Negative/Trace    Glucose, UA Negative Negative    Ketones UA Negative Negative    Blood, UA Negative Negative    Bilirubin, UA Negative Negative    Urobilinogen, Ur 0.2 0.2 - 1.0 mg/dL    Nitrite, UA Positive (A) Negative    Microscopic Examination: See below:    Reflex - Microscopic Examination   Result Value Ref Range    WBC, UA 11-30 (A) 0 - 5 /hpf    RBC, UA None seen 0 - 2 /hpf    Epithelial Cells (non renal) >10 (A) 0 - 10 /hpf    Casts, UA None seen None seen /lpf    Mucus, UA Present Not Estab.    Bacteria, UA  Few None seen/Few   T4, free   Result Value Ref Range    T4, Free 1.31 0.82 - 1.77 ng/dL   TSH   Result Value Ref Range    TSH 0.786 0.450 - 4.500 uIU/mL   T3, free   Result Value Ref Range    T3, Free 3.0 2.0 - 4.4 pg/mL   Ambig Abbrev CMP14 Default   Result Value Ref Range    Ambig Abbrev CMP14 Default Comment    Microalbumin, semiquantitative   Result Value Ref Range    Microalbumin, UR 24.0 Not Estab. ug/mL       Signed,  Balinda Quails, FNP-c  12/01/2018

## 2018-12-01 NOTE — Patient Instructions (Signed)
Health promotion and disease prevention/screening recommendations discussed.    Exercise: Optimize aerobic exercise per ACC guidelines of 150 min per week.     Healthy eating: Aim for (8) 8 oz glasses  of water per day. Dietary fiber 25 grams per day. Optimize weight by eating a well balanced diet and encouraged daily calcium and vitamin D. Heart healthy diet encouraged, such as limiting saturated fat and trans fat and replacing these fats with the poly and mono unsaturated fats. Plant sterols, fish oil, fiber, and sodium reduction is ideal.     Sleep hygiene: Recommendations for optimizing sleep hygiene includes: counseled on sleep hygiene (getting 6 hrs sleep minimum) 1) regular sleep times 2) Avoid caffeine 4-6 hours prior to going to bed 3) Avoid alcohol for at least 4-6 hours before going to bed 4) Engage in regular exercise 5) Keep daytime routine activities the same as planned 6) Stop screen time/viewing 1 hours prior to bedtime and 7) eat a well-balance diet, preferrably eat dinner no later than 2-3 hours prior to going to sleep.Reviewed labs with patient. Patient agreeable to plan.     Safety: seat belts, no texting and driving, no drinking and driving, no riding with people who have been drinking, moderation (calories, caffeine, alcohol etc), domestic violence awareness, recommend annual flu shots.

## 2018-12-01 NOTE — Telephone Encounter (Signed)
See below message

## 2018-12-09 ENCOUNTER — Ambulatory Visit (INDEPENDENT_AMBULATORY_CARE_PROVIDER_SITE_OTHER): Payer: No Typology Code available for payment source | Admitting: Family Nurse Practitioner

## 2019-02-03 ENCOUNTER — Ambulatory Visit: Payer: BC Managed Care – PPO

## 2019-02-06 ENCOUNTER — Other Ambulatory Visit (INDEPENDENT_AMBULATORY_CARE_PROVIDER_SITE_OTHER): Payer: Self-pay | Admitting: Family

## 2019-02-06 ENCOUNTER — Ambulatory Visit: Payer: BC Managed Care – PPO | Attending: Family

## 2019-02-06 DIAGNOSIS — Z1239 Encounter for other screening for malignant neoplasm of breast: Secondary | ICD-10-CM

## 2019-02-06 DIAGNOSIS — Z1231 Encounter for screening mammogram for malignant neoplasm of breast: Secondary | ICD-10-CM | POA: Insufficient documentation

## 2019-08-07 ENCOUNTER — Other Ambulatory Visit (INDEPENDENT_AMBULATORY_CARE_PROVIDER_SITE_OTHER): Payer: Self-pay | Admitting: Family

## 2019-09-16 ENCOUNTER — Other Ambulatory Visit (INDEPENDENT_AMBULATORY_CARE_PROVIDER_SITE_OTHER): Payer: Self-pay | Admitting: Family Medicine

## 2019-10-09 ENCOUNTER — Other Ambulatory Visit (INDEPENDENT_AMBULATORY_CARE_PROVIDER_SITE_OTHER): Payer: Self-pay | Admitting: Family Medicine

## 2020-03-25 ENCOUNTER — Other Ambulatory Visit (INDEPENDENT_AMBULATORY_CARE_PROVIDER_SITE_OTHER): Payer: Self-pay | Admitting: Family

## 2020-06-02 ENCOUNTER — Other Ambulatory Visit (INDEPENDENT_AMBULATORY_CARE_PROVIDER_SITE_OTHER): Payer: Self-pay | Admitting: Family

## 2020-09-13 IMAGING — MG MAMMOGRAPHY SCREENING BILATERAL 3D TOMOSYNTHESIS WITH CAD
6 series · 6 of 18 positions shown · non-contrast
Comparison: Comparison was made to prior examinations.

MAMMOGRAPHY SCREENING BILATERAL 3D TOMOSYNTHESIS WITH CAD, 09/13/2020 [DATE]: 
CLINICAL INDICATION: Screening exam.
TECHNIQUE: Digital bilateral mammograms and 3-D Tomosynthesis were obtained. 
These were interpreted both primarily and with the aid of computer-aided 
detection system. 
BREAST DENSITY: (Level C) The breasts are heterogeneously dense, which may 
obscure small masses.

[L CC]
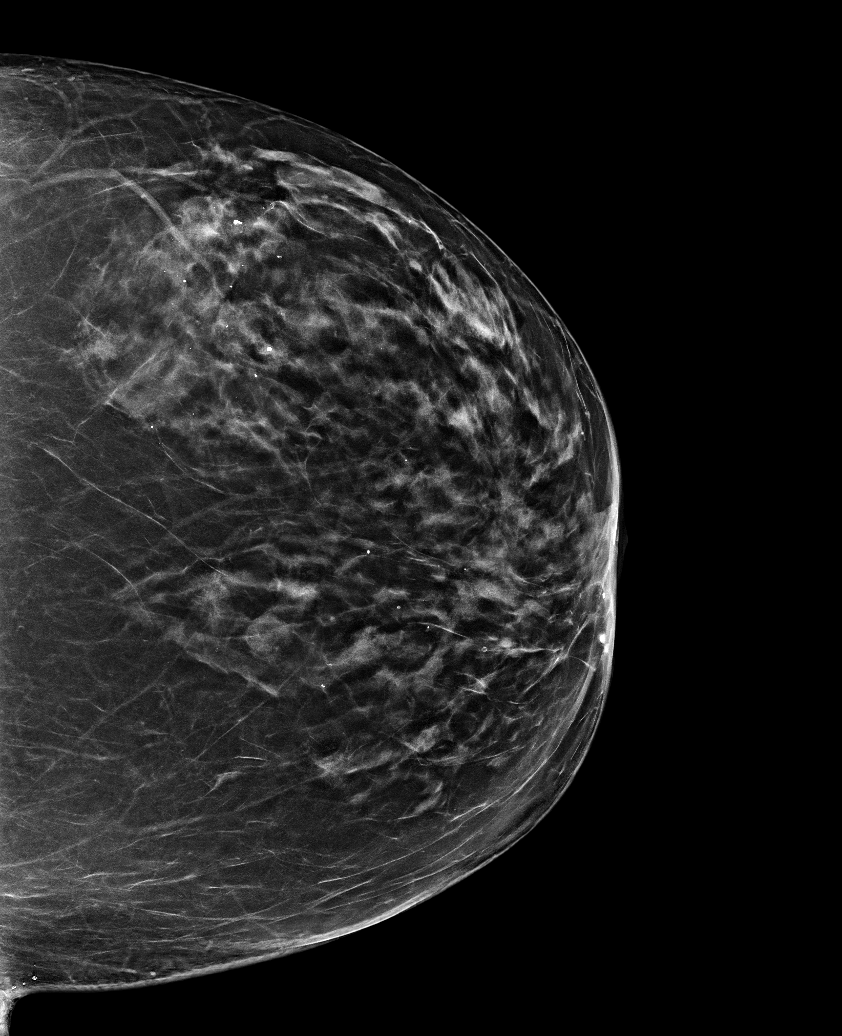

[L MLO]
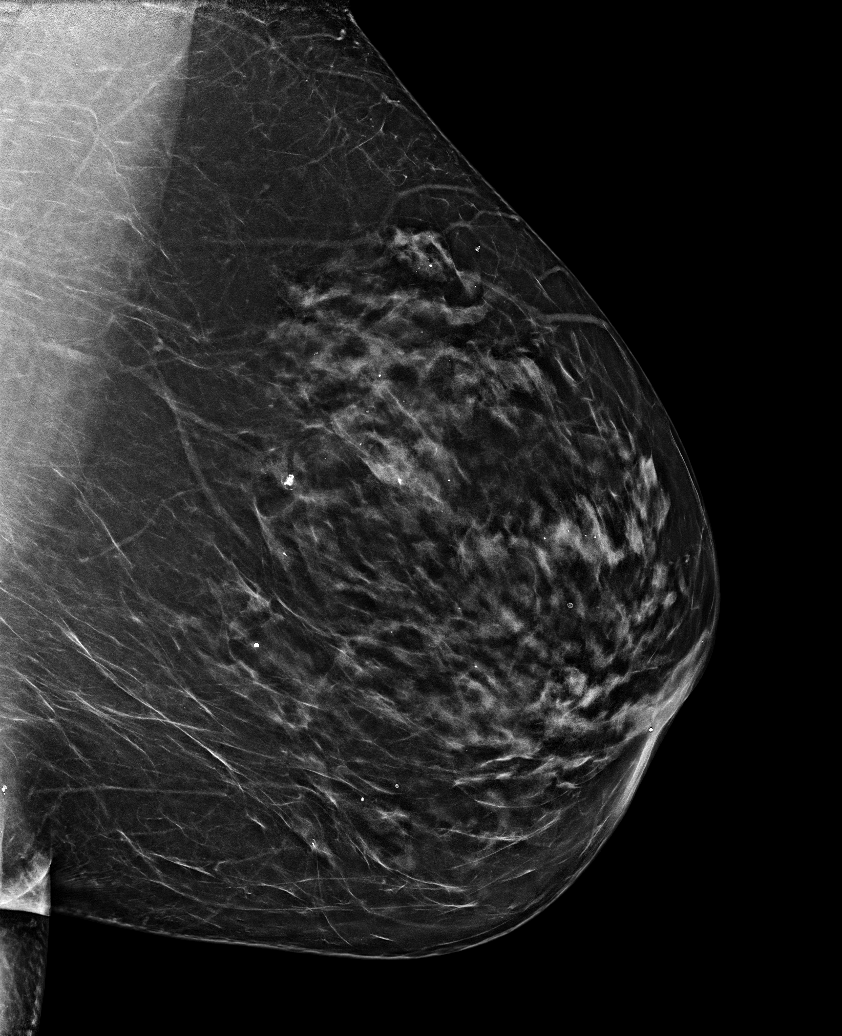

[R MLO]
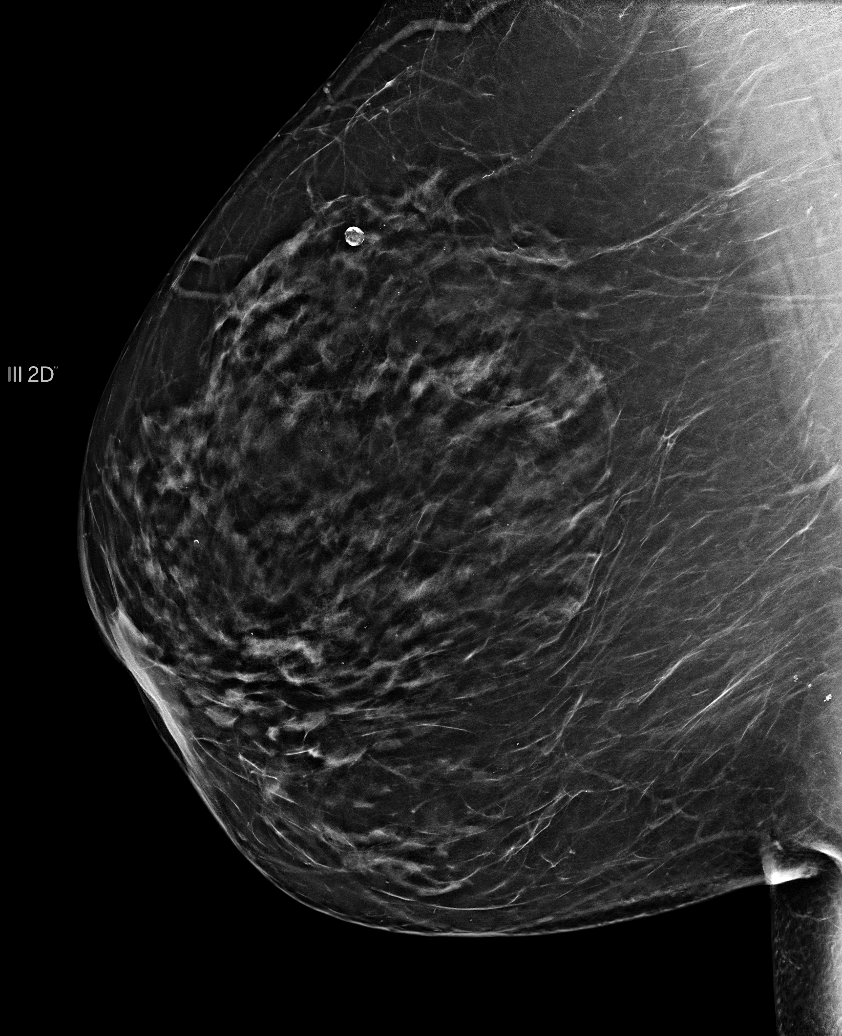

[L CC tomo · tomo slice 39/77.0]
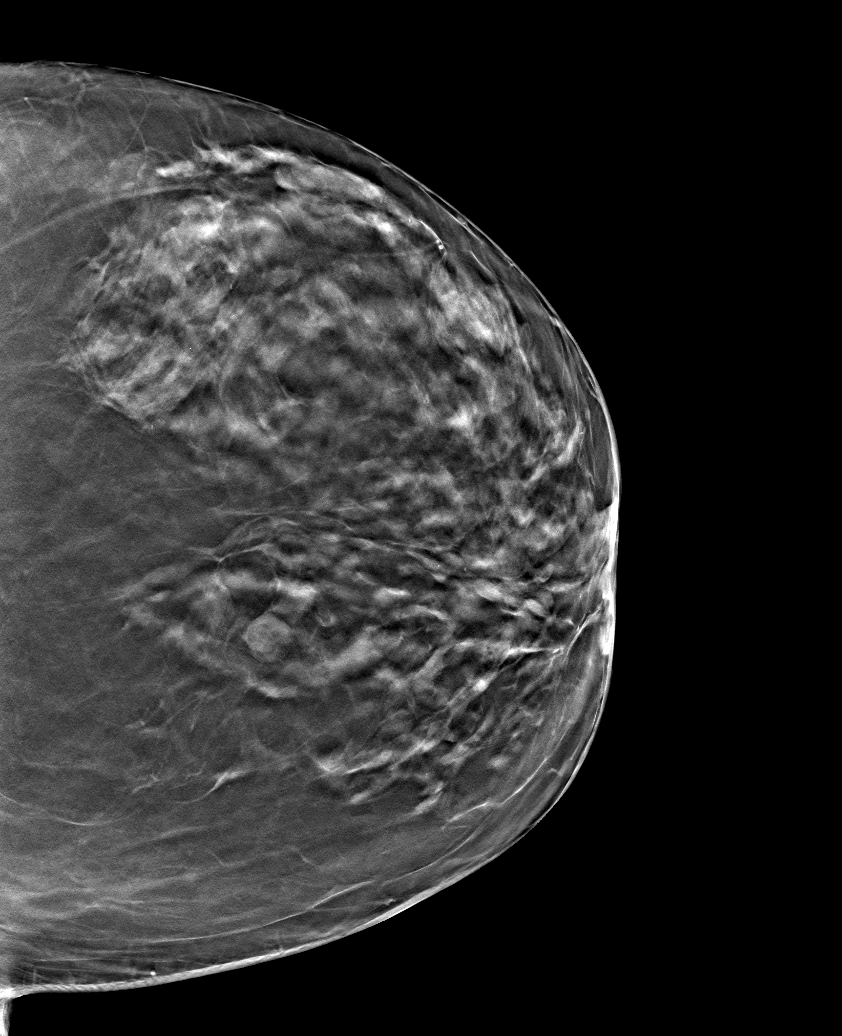

[R CC tomo · tomo slice 41/82.0]
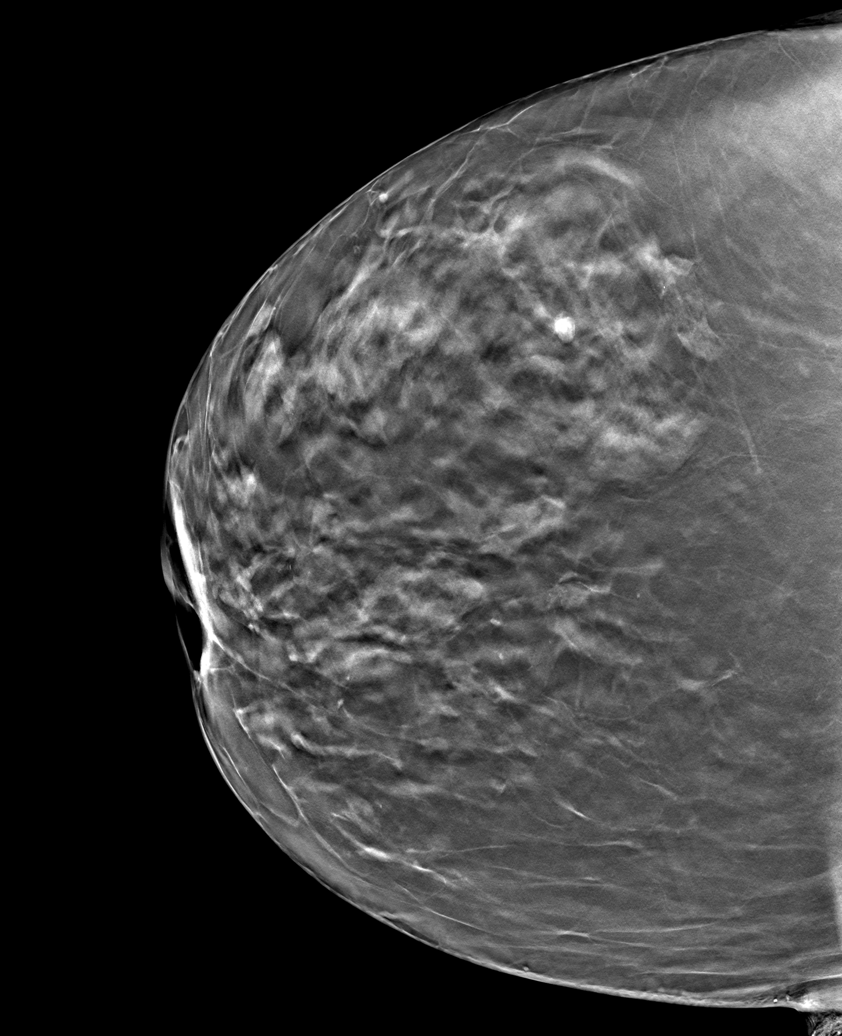

[R MLO tomo · tomo slice 43/86.0]
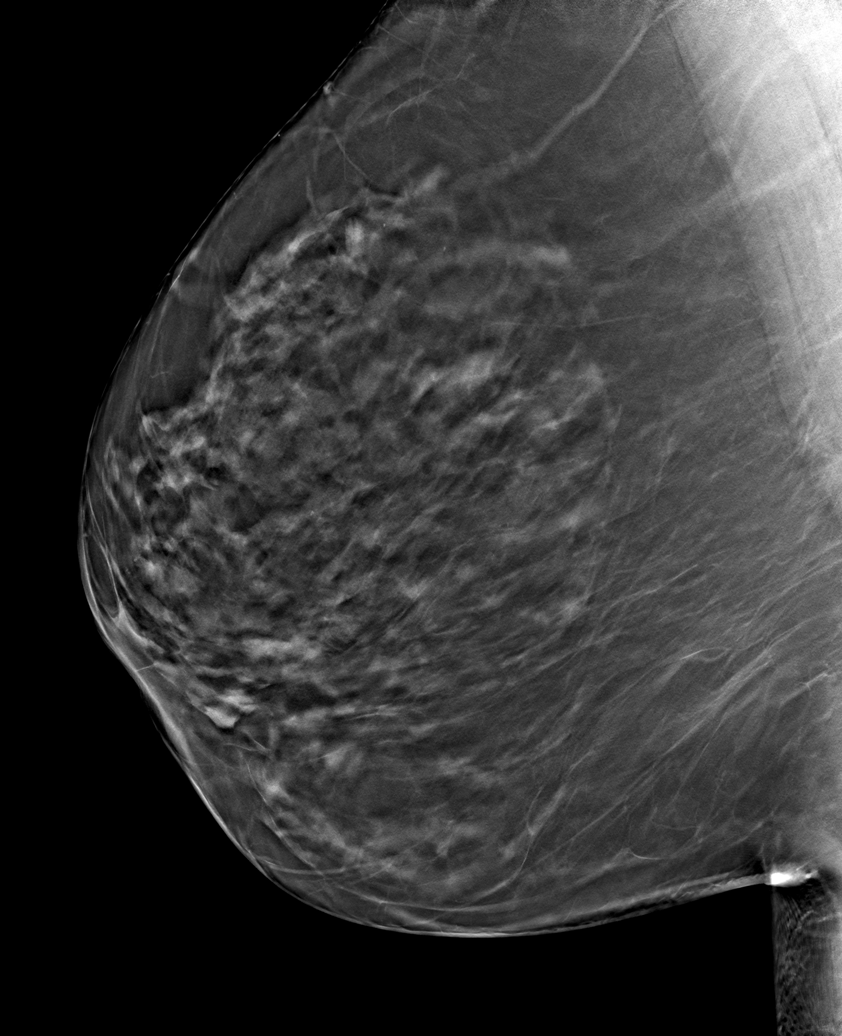

[6 of 18 positions shown; findings below may reference images not displayed]

FINDINGS: No suspicious mass, calcifications, or area of architectural 
distortion in either breast.
IMPRESSION: No mammographic evidence of malignancy in either breast. 
( BI-RADS 1) Negative mammogram. Routine mammographic follow-up is recommended.

## 2021-10-25 IMAGING — MG MAMMOGRAPHY SCREENING BILATERAL 3[PERSON_NAME]
6 of 10 series · 6 of 30 positions shown · non-contrast
Comparison: Comparison was made to prior examinations.

________________________________________________________________________________________________ 
MAMMOGRAPHY SCREENING BILATERAL 3ROBERTO TIGER, 10/25/2021 [DATE]: 
CLINICAL INDICATION: Encounter for screening mammogram.
TECHNIQUE: Digital bilateral mammograms and 3-D Tomosynthesis were obtained. 
These were interpreted both primarily and with the aid of computer-aided 
detection system.  
BREAST DENSITY: (Level C) The breasts are heterogeneously dense, which may 
obscure small masses.

[L CC]
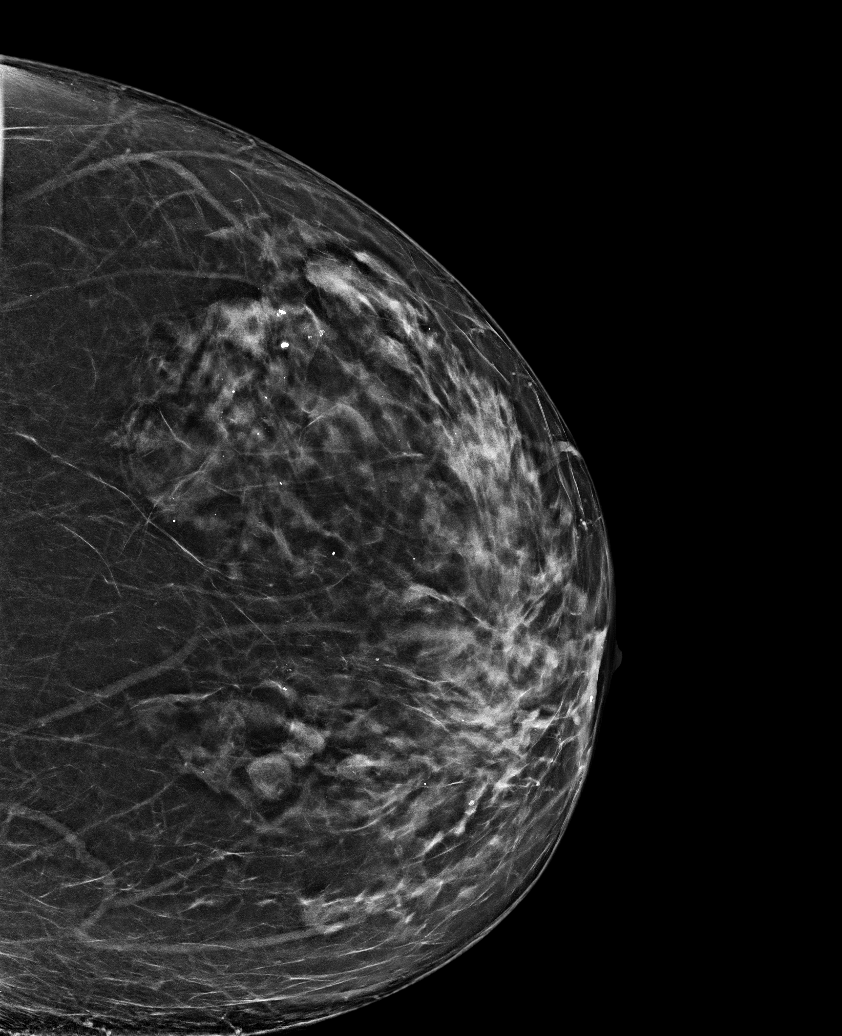

[R CV]
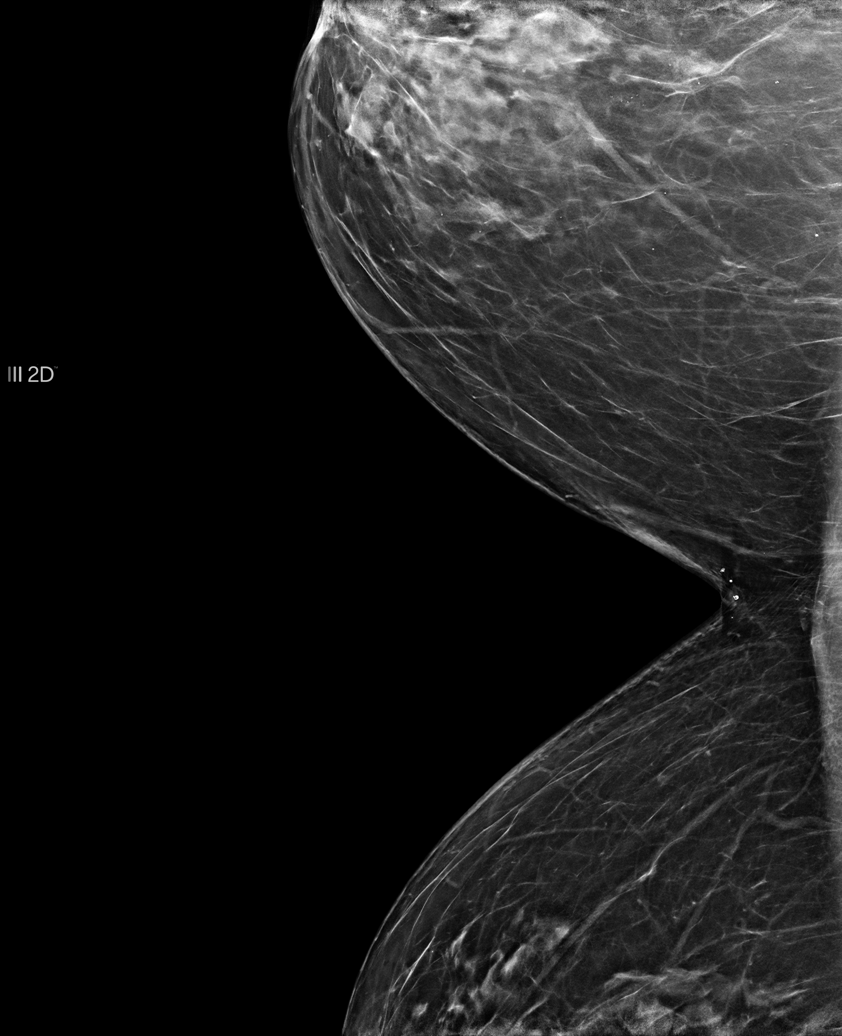

[R CC]
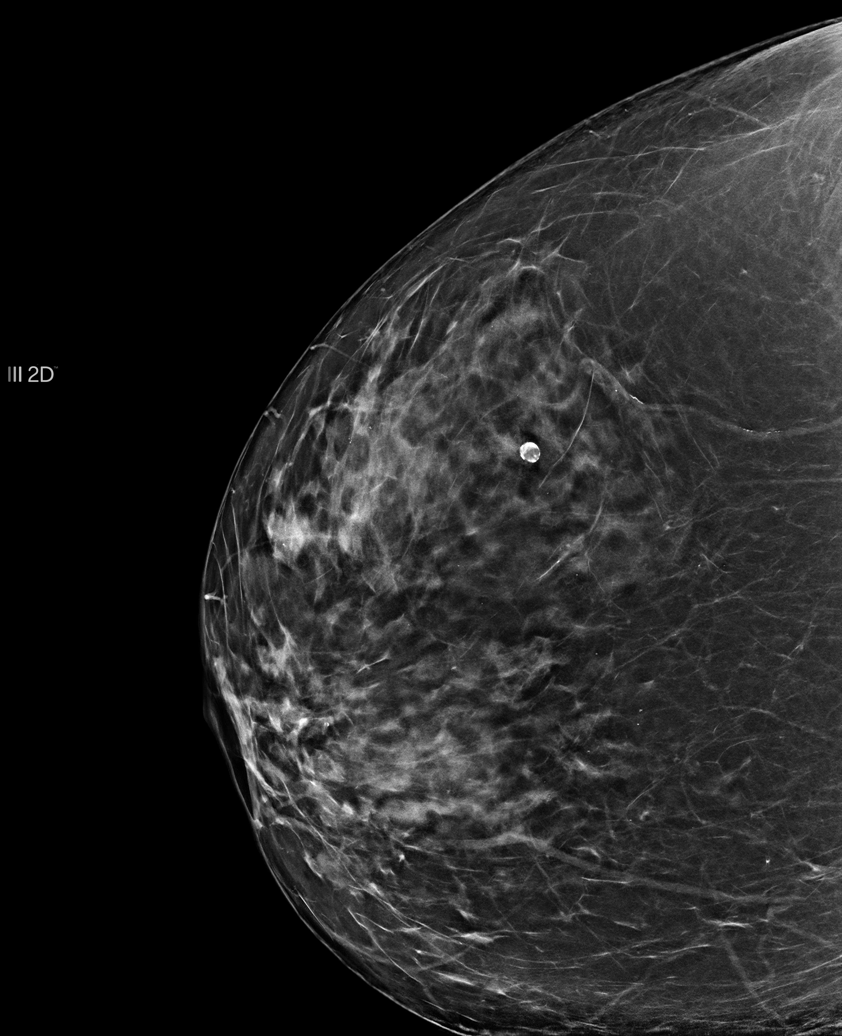

[R MLO]
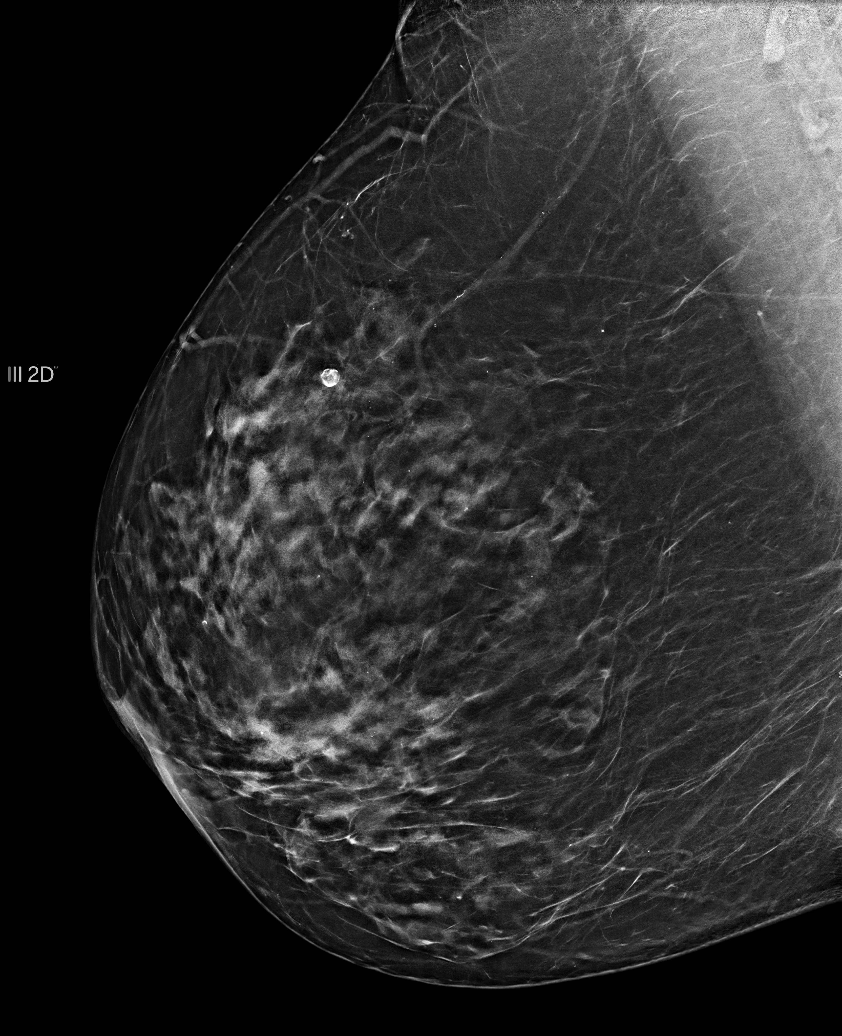

[L MLO]
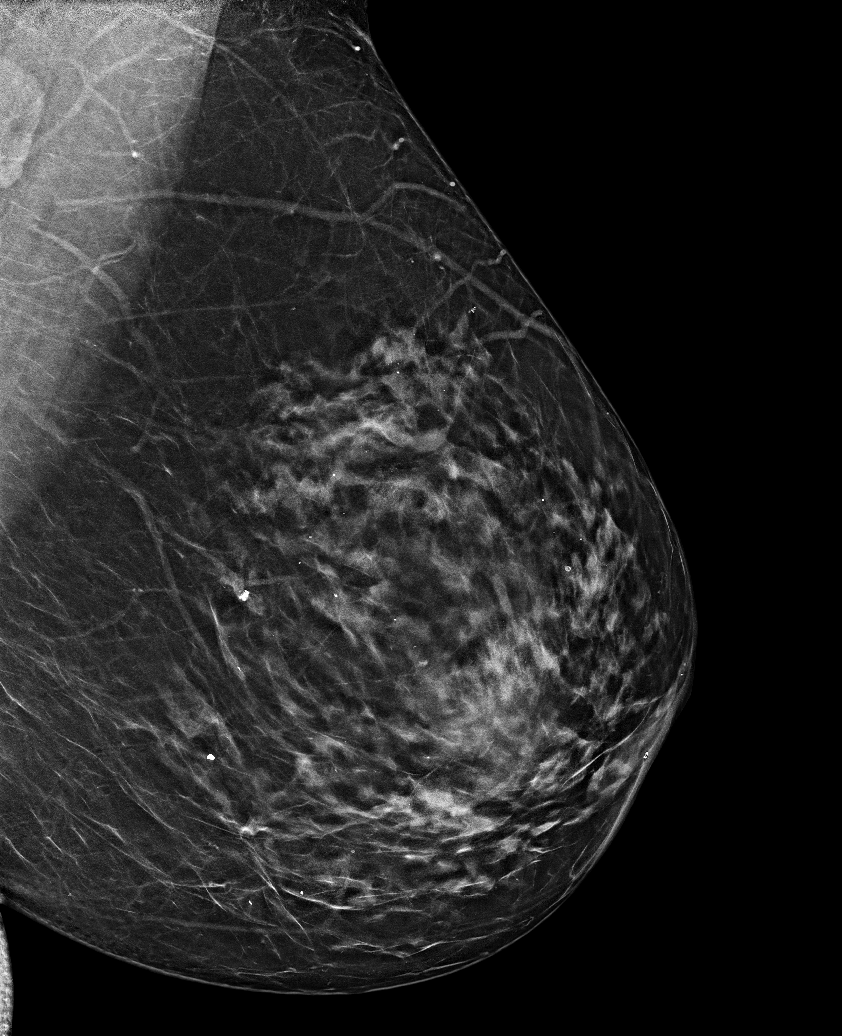

[L CC tomo · tomo slice 38/75.0]
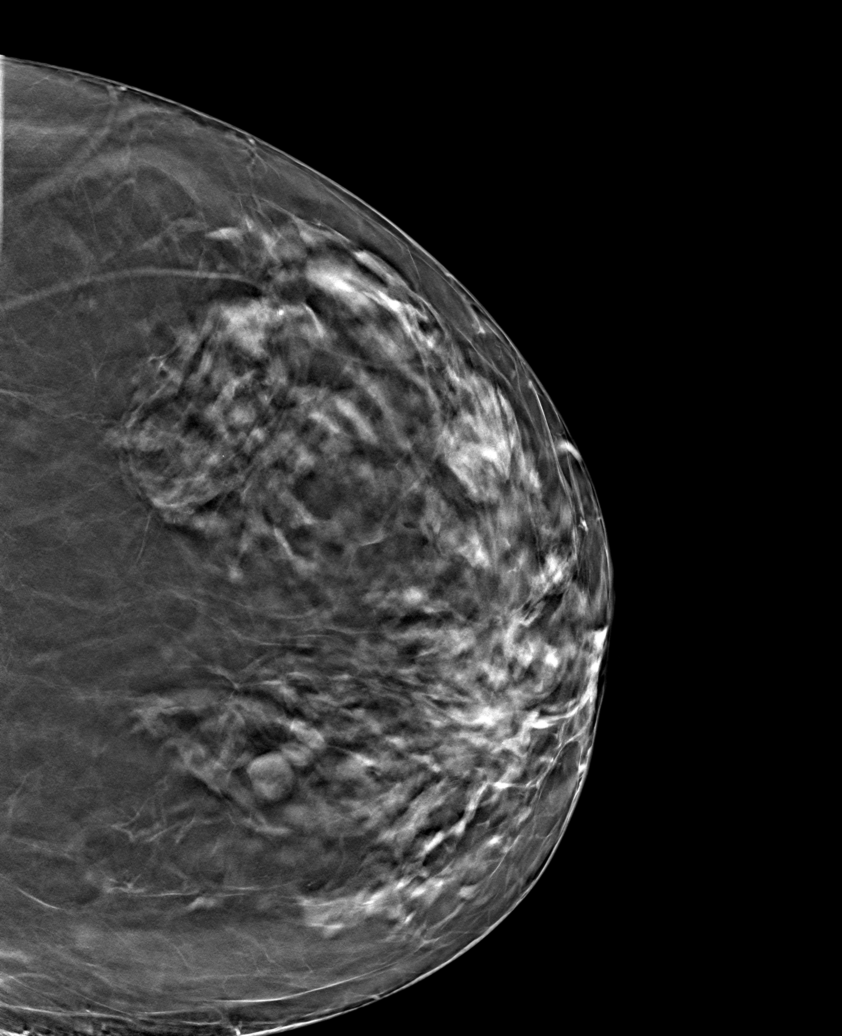

[6 of 30 positions shown; findings below may reference images not displayed]

FINDINGS: Stable nodularity and foci of asymmetry again seen in both breasts. 
Stable scattered benign-appearing calculations also seen in the breasts. No new 
suspicious mass, calcifications, or area of architectural distortion in either 
breast.
IMPRESSION: (BI-RADS 2) Benign findings. Routine mammographic follow-up is recommended.

## 2022-10-29 IMAGING — MG MAMMOGRAPHY SCREENING BILATERAL 3[PERSON_NAME]
6 of 12 series · 6 of 36 positions shown · non-contrast
Comparison: Comparison was made to prior examinations.

________________________________________________________________________________________________ 
MAMMOGRAPHY SCREENING BILATERAL 3BENRABAH ETOIL, 10/29/2022 [DATE]: 
CLINICAL INDICATION: Encounter for screening mammogram.
TECHNIQUE: Digital bilateral mammograms and 3-D Tomosynthesis were obtained. 
These were interpreted both primarily and with the aid of computer-aided 
detection system.  
BREAST DENSITY: (Level C) The breasts are heterogeneously dense, which may 
obscure small masses.

[R CC]
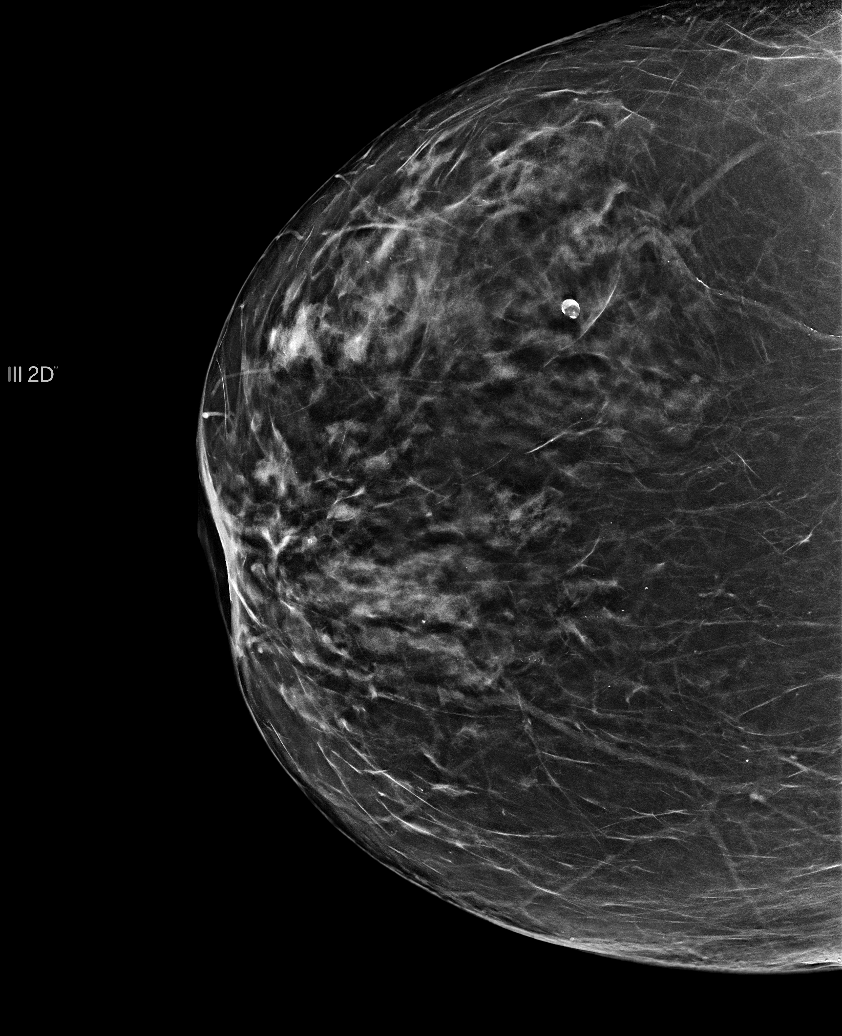

[L XCCL]
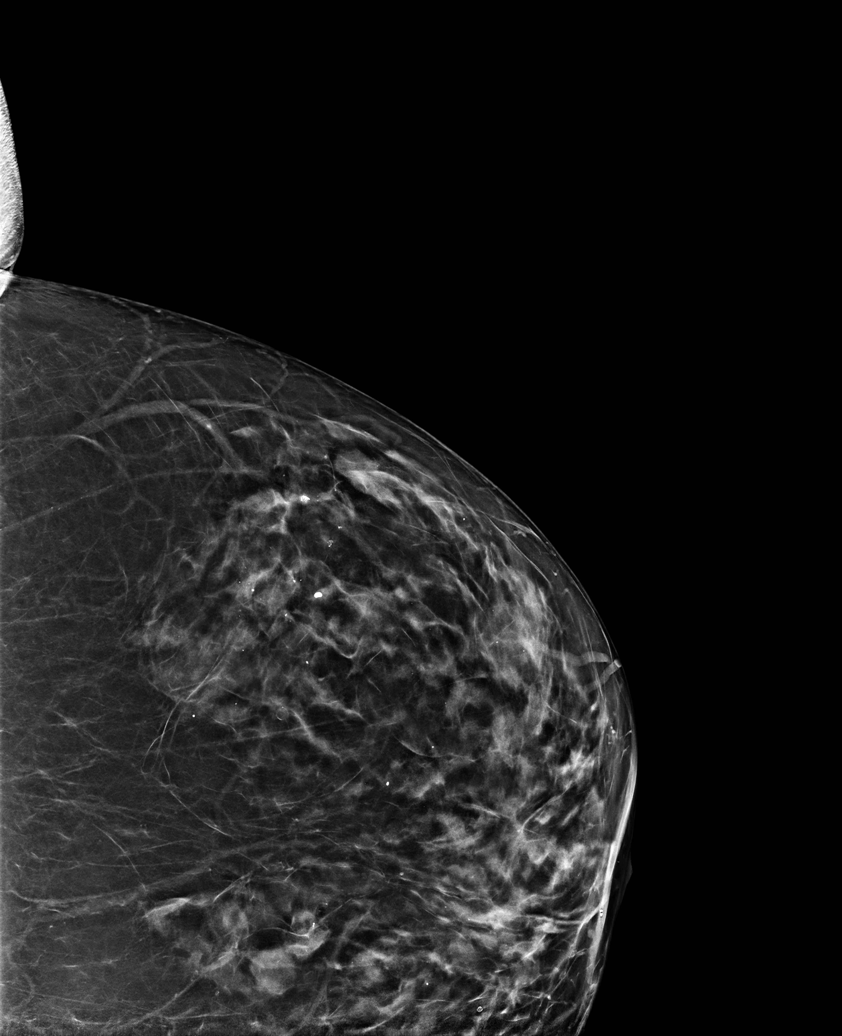

[R MLO]
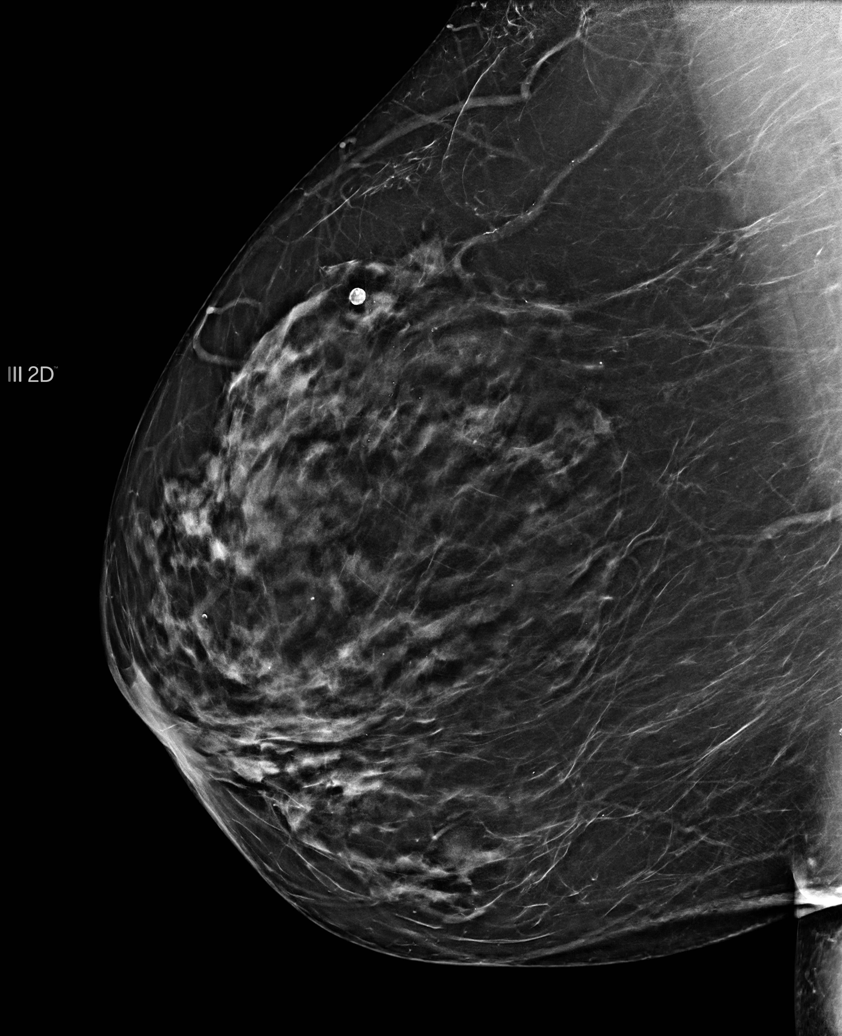

[L CC]
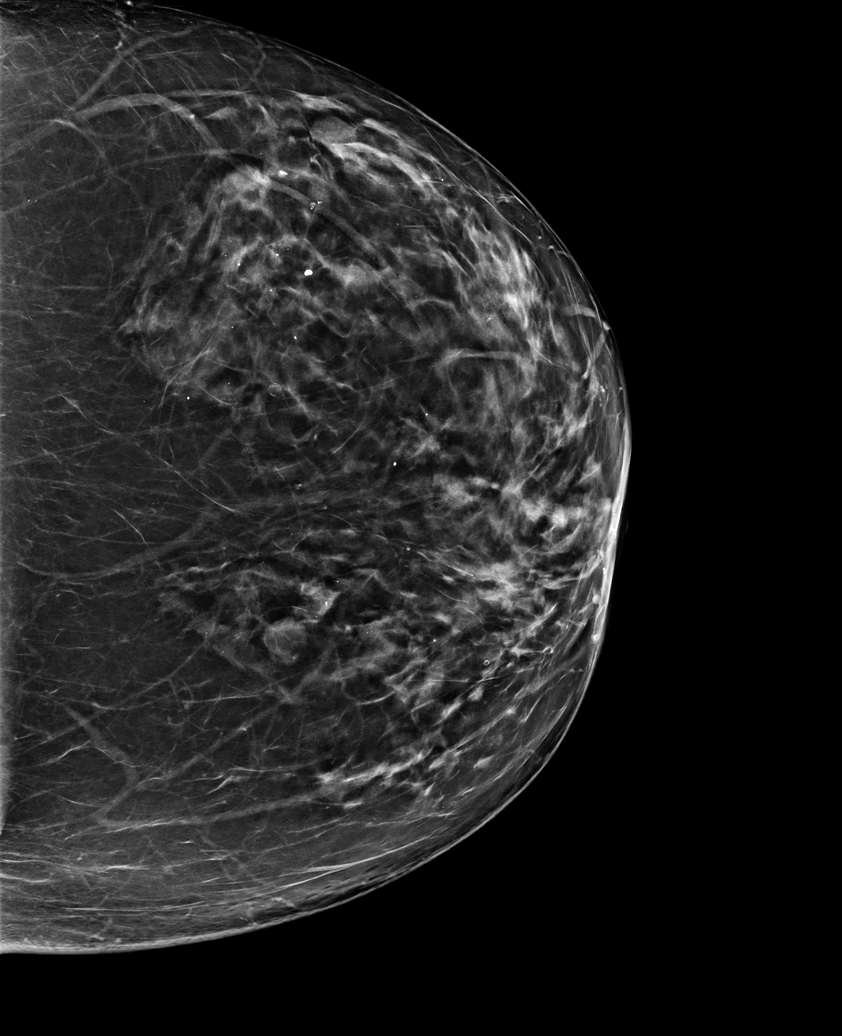

[L MLO]
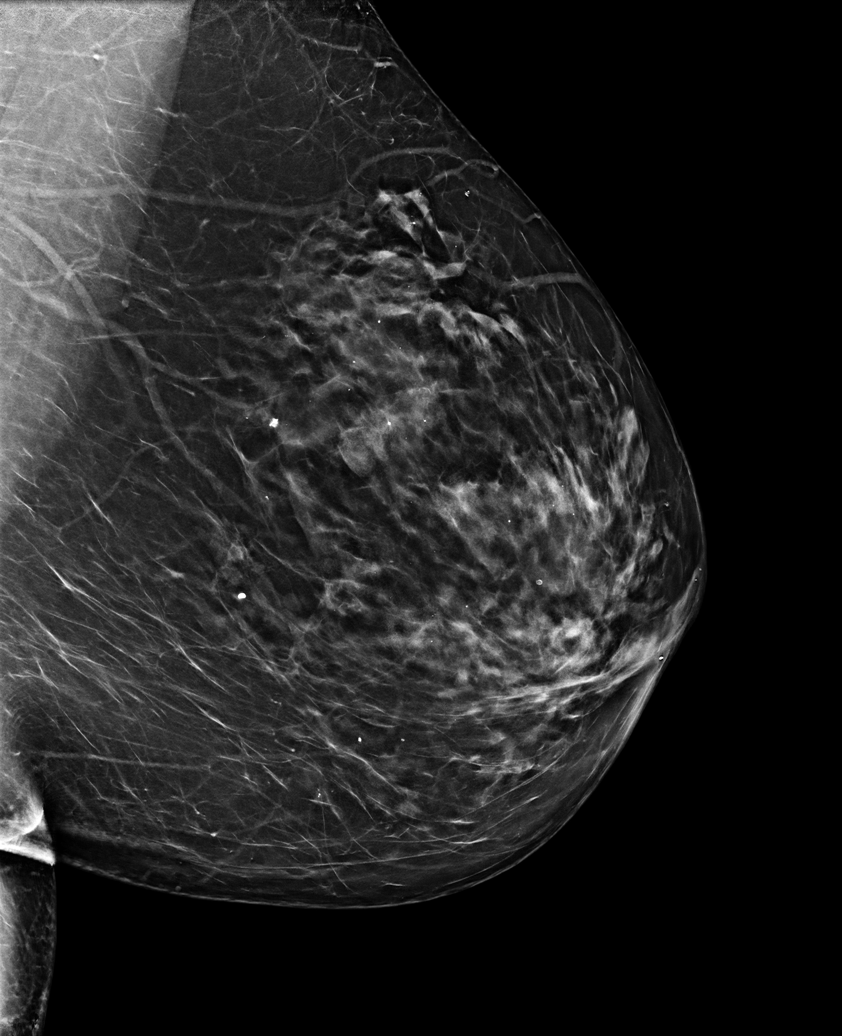

[R XCCL]
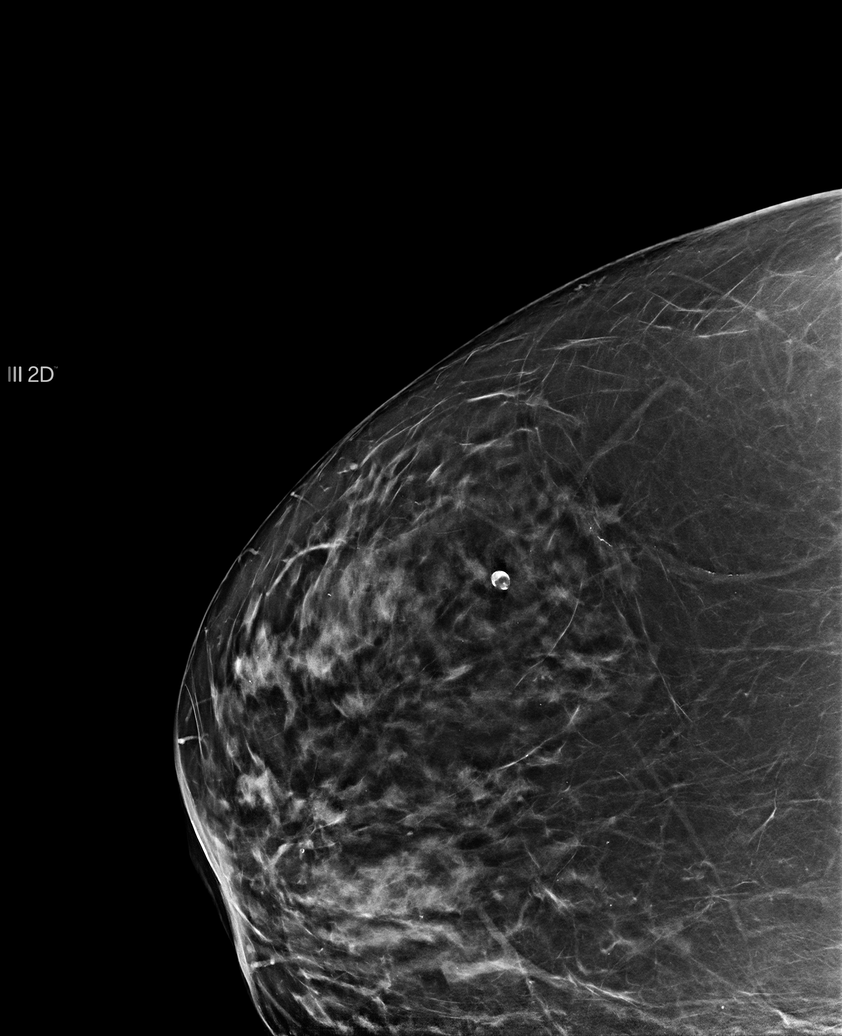

[6 of 36 positions shown; findings below may reference images not displayed]

FINDINGS: Stable suspected cysts. Scattered benign calcifications. No new 
abnormality seen.
IMPRESSION: (BI-RADS 2) Benign findings. Routine mammographic follow-up is recommended.

## 2023-04-01 IMAGING — MR MRI THORACIC SPINE WITHOUT CONTRAST
5 of 9 series · 18 of 48 positions shown · IV contrast (gadolinium)
Comparison: None.

________________________________________________________________________________________________ 
MRI THORACIC SPINE WITHOUT CONTRAST, 04/01/2023 [DATE]: 
CLINICAL INDICATION: Chronic bilateral leg pain. Back pain.
TECHNIQUE: Multiplanar, multiecho position MR images of the thoracic spine were 
performed without intravenous gadolinium enhancement. Patient was scanned on a 
1.5T magnet.

[Series 101: t2_sag_count · sagittal · 4.0mm · 0.62mm/px · 2 of 22 slices shown]
[im 1/22]
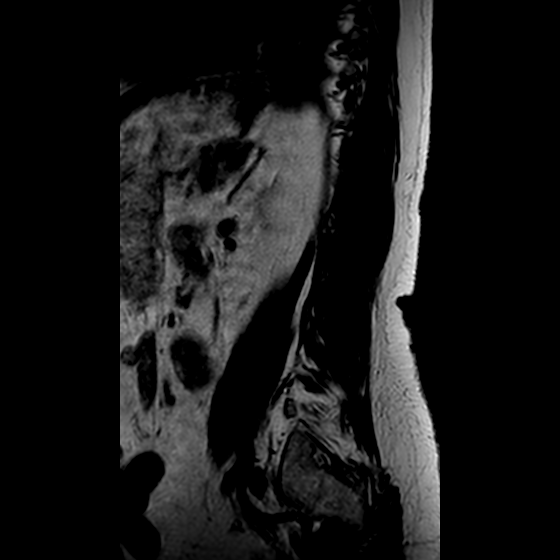
[im 22/22]
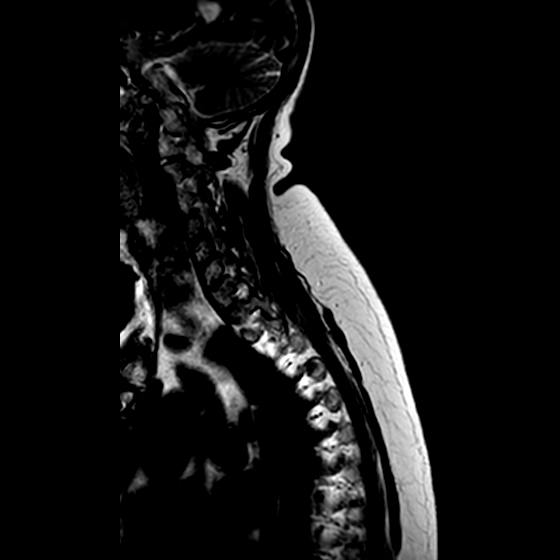

[Series 102: T2 · sagittal · 4.0mm · 0.62mm/px · 2 of 11 slices shown (1 of 2)]
[im 1/11]
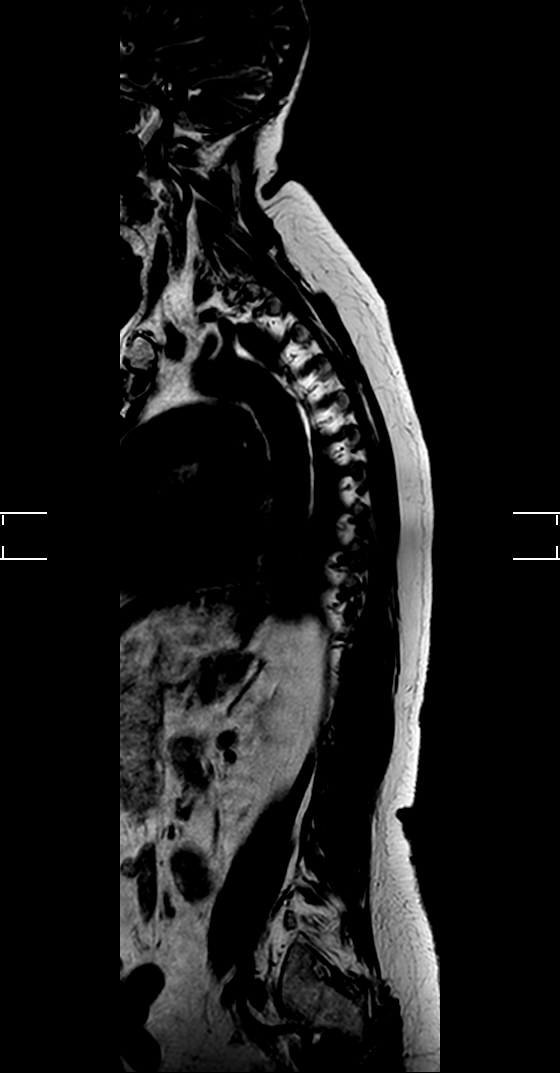
[im 11/11]
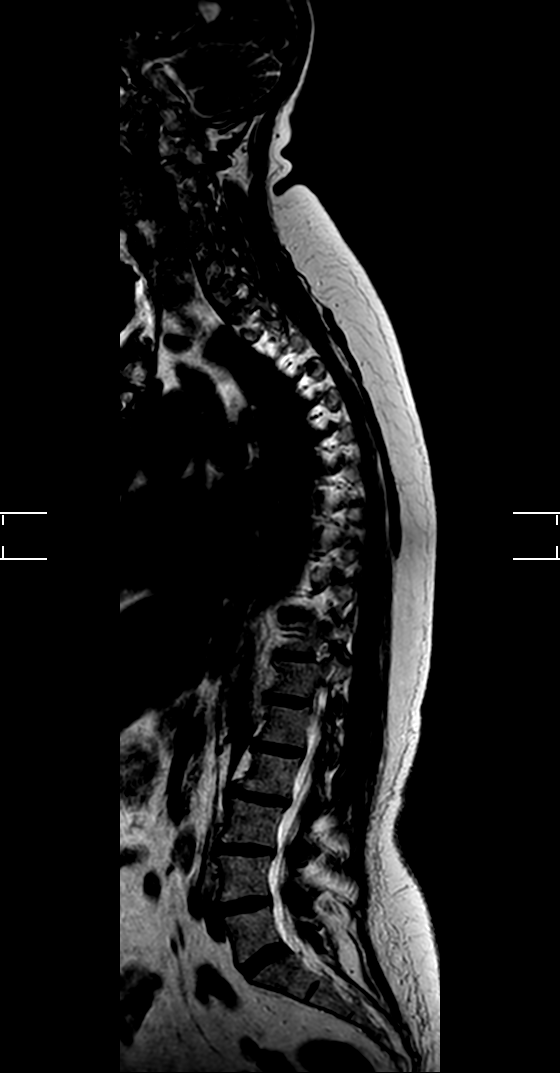

[Series 201: t2_cor_count · coronal · 4.0mm · 0.61mm/px · 3 of 22 slices shown]
[im 1/22]
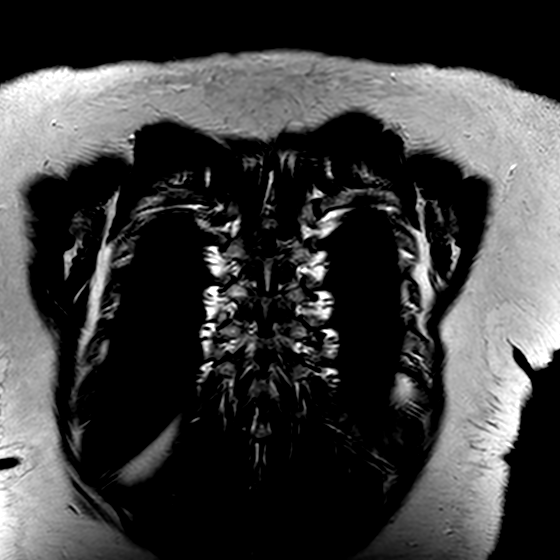
[im 11/22]
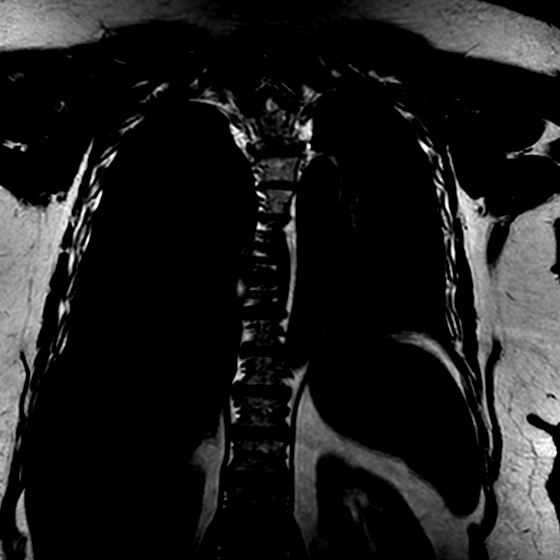
[im 22/22]
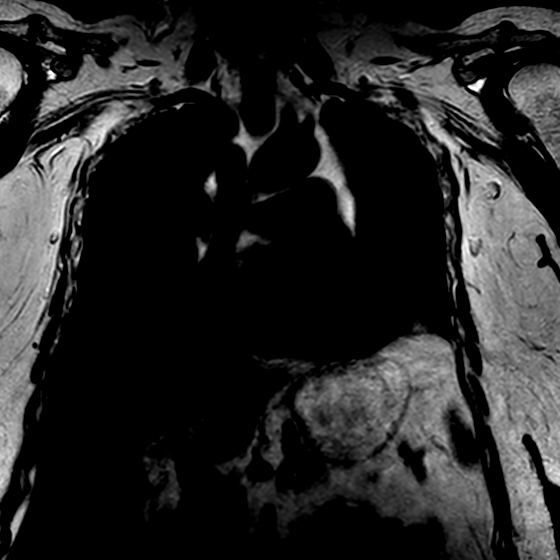

[Series 301: T1 · sagittal · 3.0mm · 0.56mm/px · 3 of 21 slices shown]
[im 1/21]
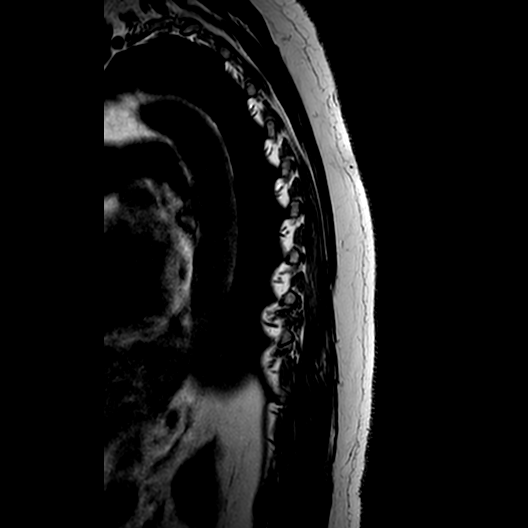
[im 11/21]
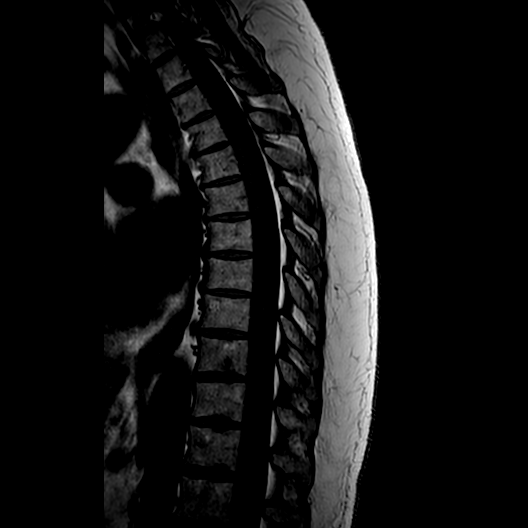
[im 21/21]
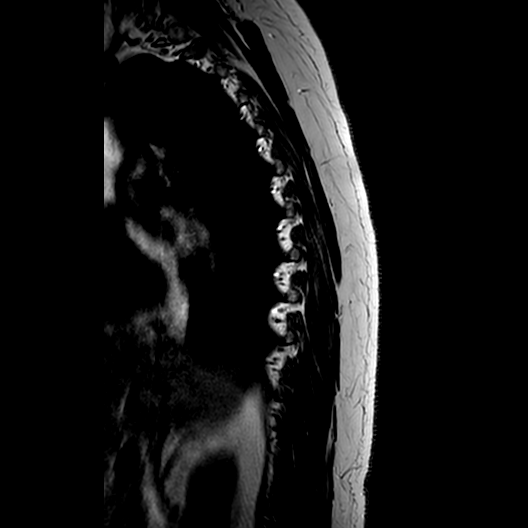

[Series 601: T2 · axial · 4.0mm · 0.42mm/px · z∈[-259,-34]mm · 8 of 71 slices shown (2 of 2)]
[im 1/71]
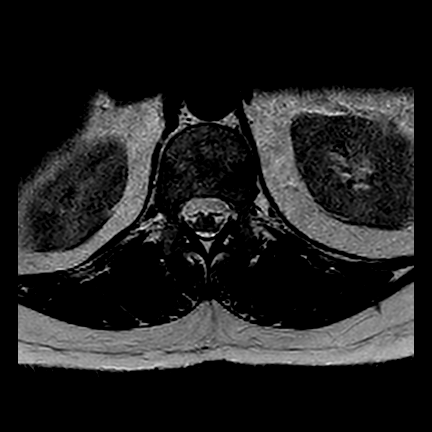
[im 8/71]
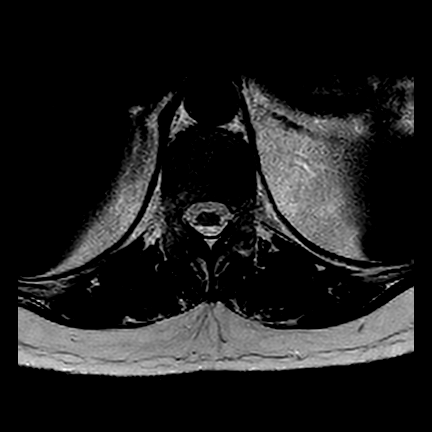
[im 24/71]
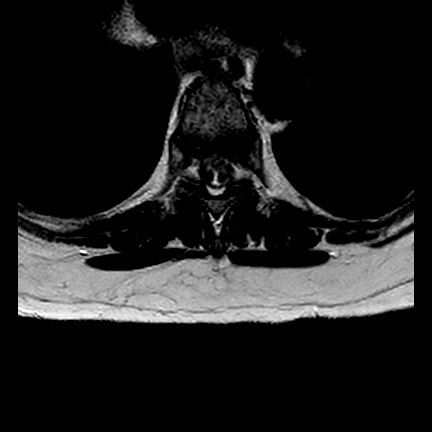
[im 32/71]
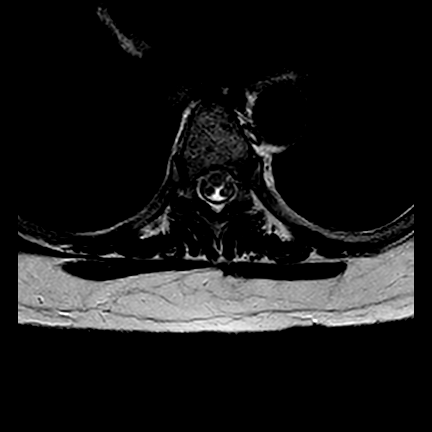
[im 39/71]
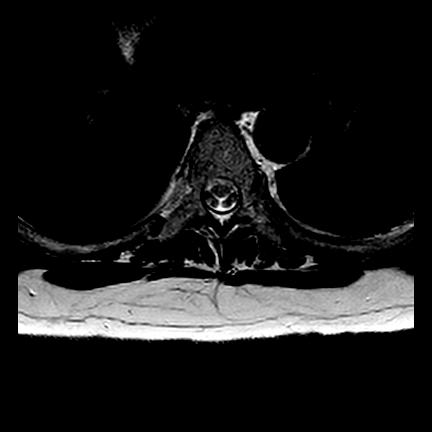
[im 47/71]
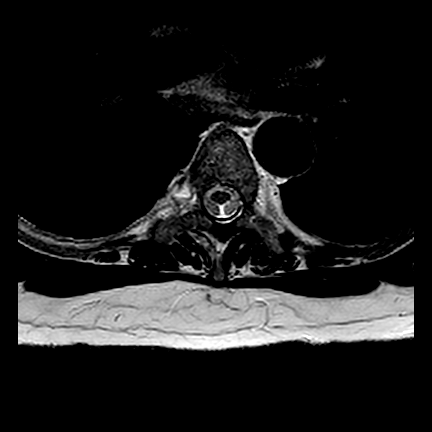
[im 63/71]
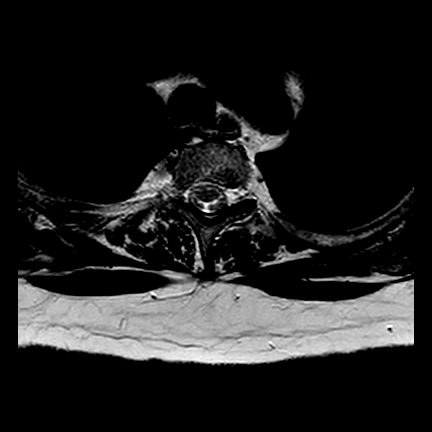
[im 71/71]
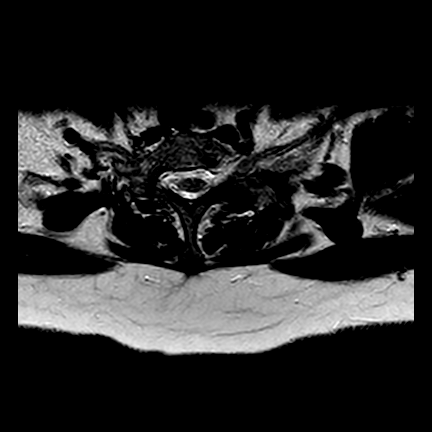

[18 of 48 positions shown; findings below may reference images not displayed]

FINDINGS: -------------------------------------------------------------------------------- 
----------------- 
GENERAL: 
ALIGNMENT: Leftward curvature of the upper thoracic spine. 
VERTEBRAL BODY HEIGHT: Normal.  
MARROW SIGNAL: No focal suspect signal abnormality. 
CORD SIGNAL: Normal.  
ADDITIONAL FINDINGS: None. 
-------------------------------------------------------------------------------- 
---------------- 
RELEVANT SEGMENTAL: (levels with disc herniation, severe stenosis, cord 
deformity, or significant findings) 
T7-T8: Left paracentral disc subarticular disc herniation with mild deformity of 
left ventral cord. Mild left-sided central canal narrowing. No significant 
neural foraminal narrowing. 
T8-T9: Left paracentral disc subarticular shallow disc herniation. Mild 
left-sided central canal narrowing. No significant neural foraminal narrowing. 
-------------------------------------------------------------------------------- 
---------------
IMPRESSION: Discogenic changes at T7-T8 and T8-T9. Mild cord deformity at T7-T8.

## 2023-09-16 IMAGING — MR MRI CERVICAL SPINE W/WO CONTRAST
7 of 11 series · 31 of 48 positions shown · IV contrast (gadavist)
Comparison: MRI thoracic spine April 01, 2023.

________________________________________________________________________________________________ 
MRI CERVICAL SPINE W/WO CONTRAST, 09/16/2023 [DATE]: 
CLINICAL INDICATION: Cervicalgia , right-sided neck pain.
TECHNIQUE: Multiplanar, multiecho position MR images of the cervical spine were 
performed without and with 10 mL of Gadavist injected intravenously by hand.

[Series 201: survey · axial · 10.0mm · 1.25mm/px · z∈[-71,+145]mm · 3 of 10 slices shown]
[im 1/10]
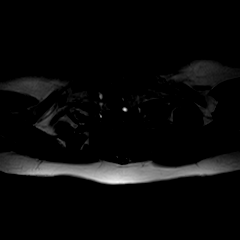
[im 5/10]
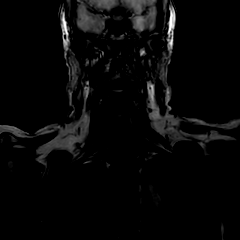
[im 10/10]
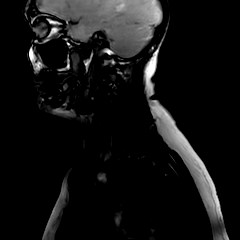

[Series 301: t2w_cor-surv · coronal · 5.0mm · 0.67mm/px · 3 of 7 slices shown]
[im 1/7]
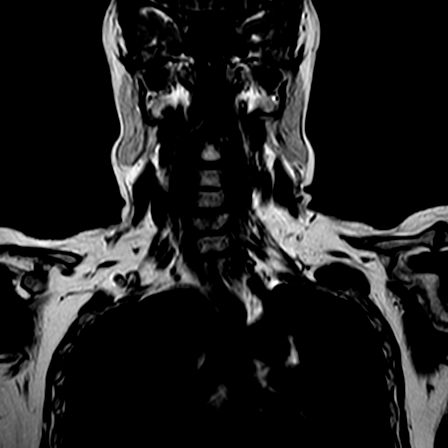
[im 4/7]
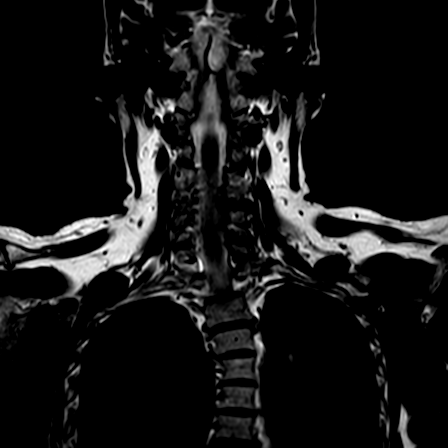
[im 7/7]
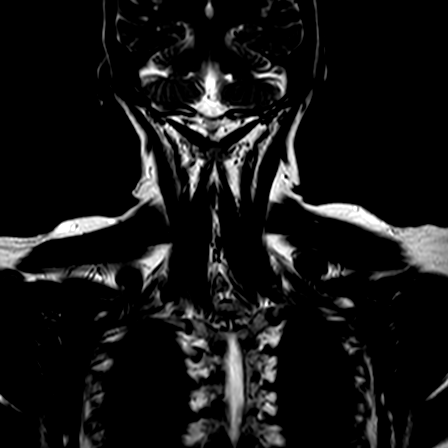

[Series 401: T1 · sagittal · 3.0mm · 0.39mm/px · 4 of 19 slices shown (1 of 2)]
[im 1/19]
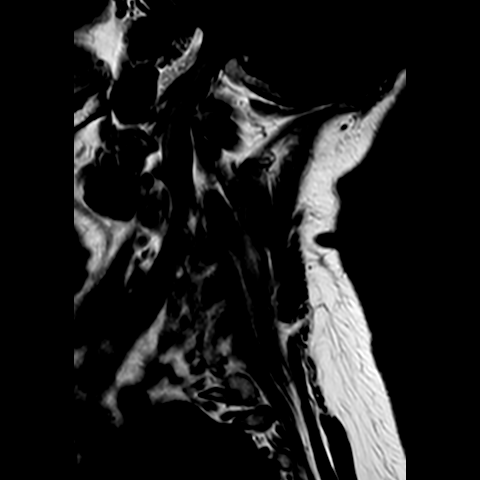
[im 7/19]
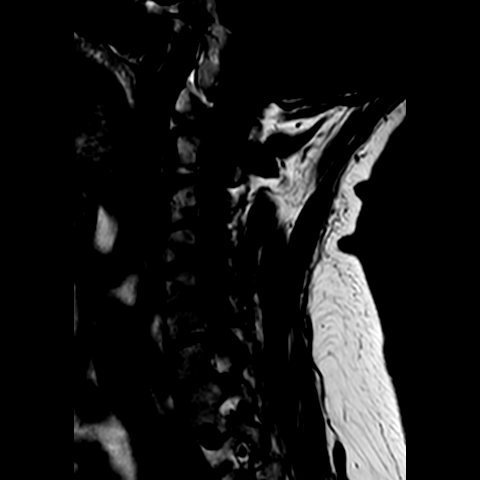
[im 13/19]
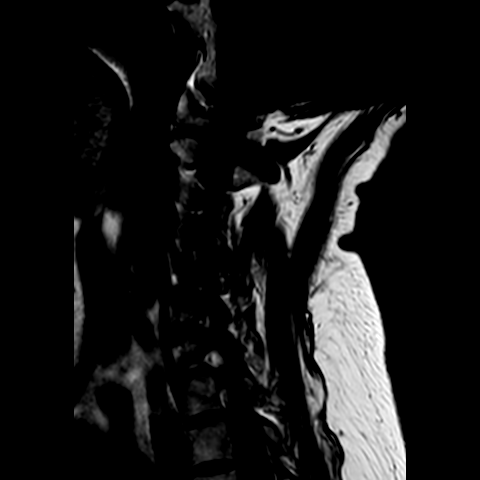
[im 19/19]
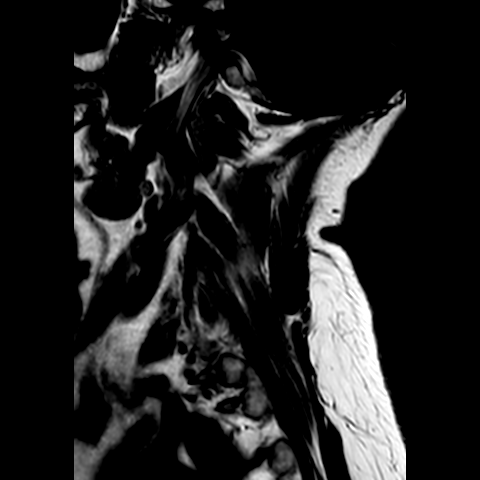

[Series 502: (id)_mdixon_tse · sagittal · 3.0mm · 0.42mm/px · 3 of 15 slices shown]
[im 1/15]
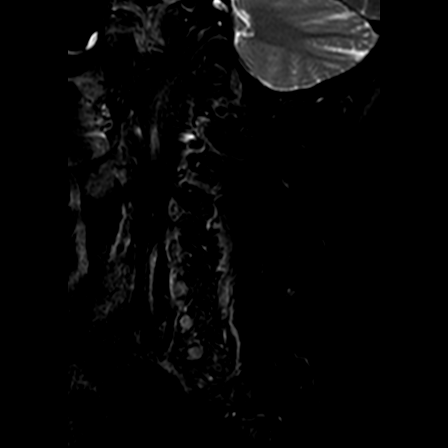
[im 8/15]
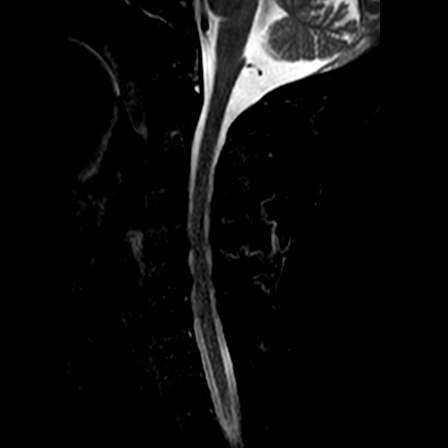
[im 15/15]
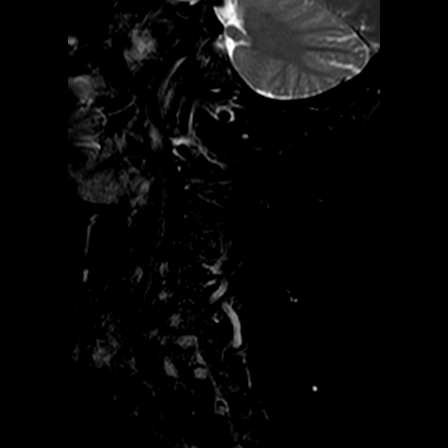

[Series 701: T2 · axial · 3.0mm · 0.31mm/px · z∈[-76,+13]mm · 6 of 30 slices shown]
[im 1/30]
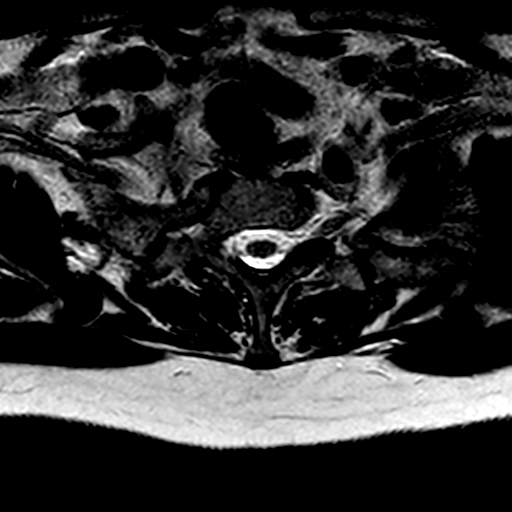
[im 6/30]
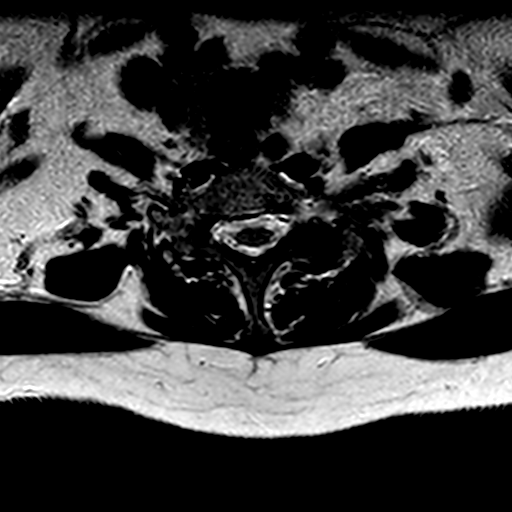
[im 12/30]
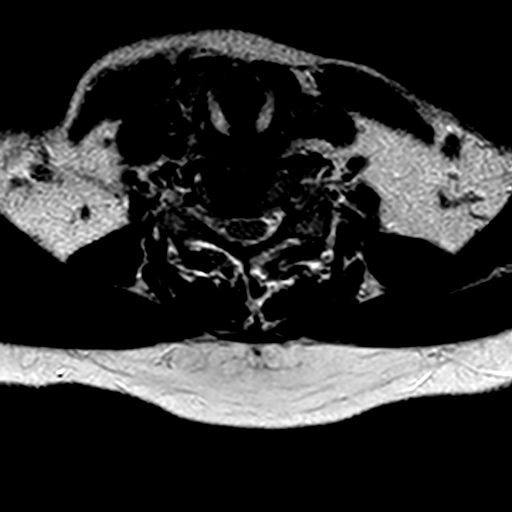
[im 18/30]
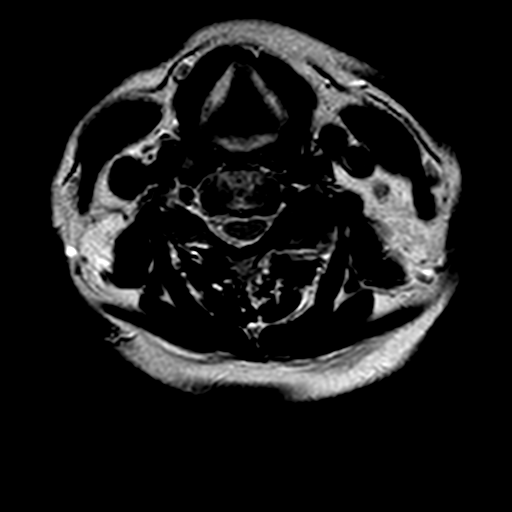
[im 24/30]
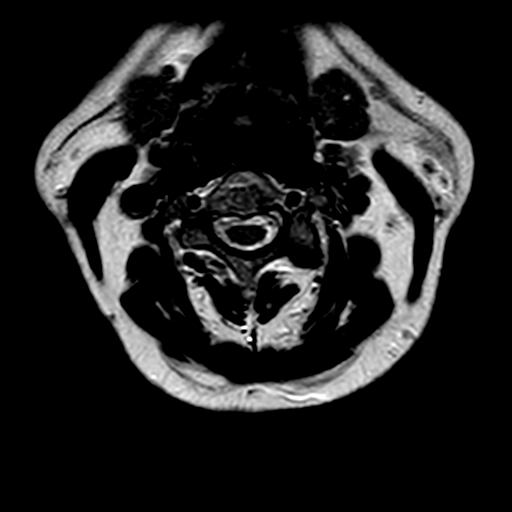
[im 30/30]
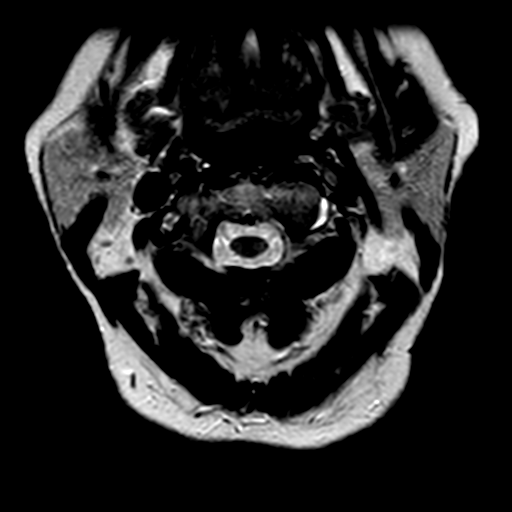

[Series 801: T1 · axial · 3.0mm · 0.59mm/px · z∈[-74,+15]mm · 6 of 30 slices shown (2 of 2)]
[im 1/30]
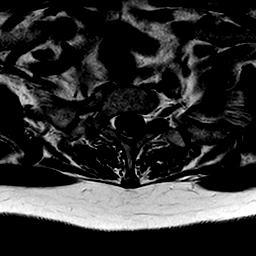
[im 6/30]
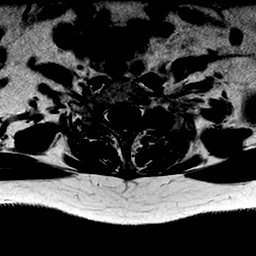
[im 12/30]
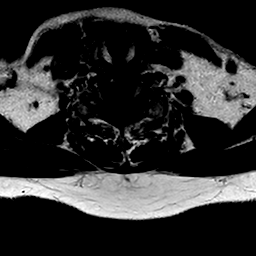
[im 18/30]
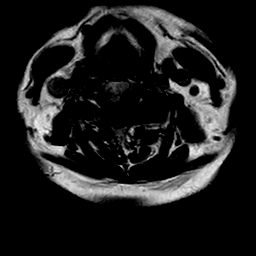
[im 24/30]
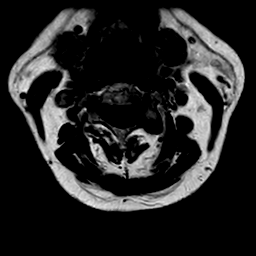
[im 30/30]
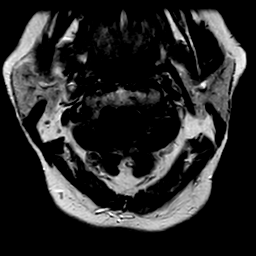

[Series 1001: T1 post-contrast · axial · 3.0mm · 0.59mm/px · z∈[-74,+15]mm · 6 of 30 slices shown]
[im 1/30]
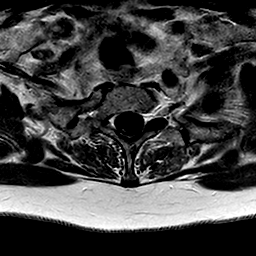
[im 6/30]
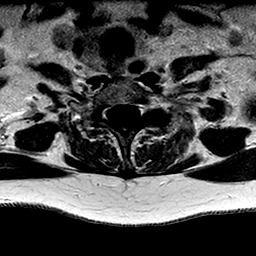
[im 12/30]
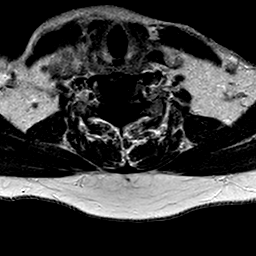
[im 18/30]
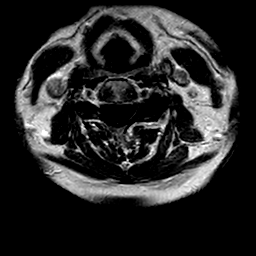
[im 24/30]
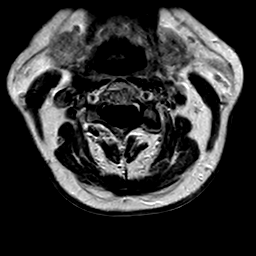
[im 30/30]
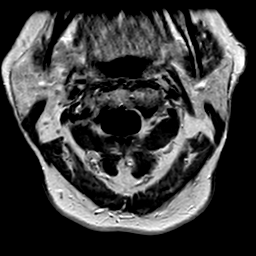

[31 of 48 positions shown; findings below may reference images not displayed]

FINDINGS: -------------------------------------------------------------------------------- 
----------------- 
GENERAL: 
ALIGNMENT: Minimal dextroconvex lower cervical scoliosis with levoconvex 
cervical thoracic scoliosis. Straightening of the normal cervical lordosis with 
grade 1 retrolisthesis C5 on C6. 
VERTEBRAL BODY HEIGHT: Normal.  
MARROW SIGNAL: No focal suspect signal abnormality. 
CORD SIGNAL: Normal.  No pathologic enhancement. 
ADDITIONAL FINDINGS: 4 mm subcutaneous lesion within the posterior soft tissues 
of the left neck is most likely epidermal or sebaceous cyst. Retropharyngeal 
course of the left common carotid artery.. 
-------------------------------------------------------------------------------- 
---------------- 
SEGMENTAL:  
CRANIOCERVICAL JUNCTION: No significant stenosis. 
C2-C3: Left facet arthropathy. Patent canal and foramina. 
C3-C4: Loss of disc height. Ventral cord flattening with borderline canal 
stenosis. Foramina patent. Left facet fused. 
C4-C5: Loss of disc height. Mild canal stenosis with ventral cord flattening. 
Mild uncinate spurring with mild right and mild to moderate left foraminal 
narrowing. Mild bilateral facet arthropathy. 
C5-C6: Loss of disc height. Mild canal stenosis with ventral cord flattening and 
bilateral uncinate spurring. Mild right and mild to moderate left foraminal 
narrowing. 
C6-C7: Loss of disc height with patent canal. Foramina appear patent. Left facet 
arthropathy. 
C7-T1: Normal disc height. No herniation. Normal facets. No spinal canal or 
neural foraminal stenosis. 
-------------------------------------------------------------------------------- 
---------------
IMPRESSION: Degenerative and scoliotic changes. No critical or significant canal stenosis 
although there is ventral cord flattening C3-C4 through C5-C6. Foraminal 
narrowing as detailed above most significant at C4-C5 and C5-C6.
